# Patient Record
Sex: Male | Born: 1970 | Race: White | Hispanic: No | Marital: Married | Smoking: Current every day smoker
Health system: Southern US, Community
[De-identification: ages and names within clinical notes are randomized; demographics above are authoritative.]

## PROBLEM LIST (undated history)

## (undated) DIAGNOSIS — I2510 Atherosclerotic heart disease of native coronary artery without angina pectoris: Secondary | ICD-10-CM

## (undated) DIAGNOSIS — G4730 Sleep apnea, unspecified: Secondary | ICD-10-CM

## (undated) DIAGNOSIS — M1990 Unspecified osteoarthritis, unspecified site: Secondary | ICD-10-CM

## (undated) DIAGNOSIS — I10 Essential (primary) hypertension: Secondary | ICD-10-CM

## (undated) DIAGNOSIS — I219 Acute myocardial infarction, unspecified: Secondary | ICD-10-CM

## (undated) DIAGNOSIS — E785 Hyperlipidemia, unspecified: Secondary | ICD-10-CM

## (undated) HISTORY — DX: Atherosclerotic heart disease of native coronary artery without angina pectoris: I25.10

## (undated) HISTORY — DX: Sleep apnea, unspecified: G47.30

## (undated) HISTORY — DX: Essential (primary) hypertension: I10

## (undated) HISTORY — DX: Hyperlipidemia, unspecified: E78.5

## (undated) HISTORY — DX: Acute myocardial infarction, unspecified: I21.9

## (undated) HISTORY — PX: NO PAST SURGERIES: SHX2092

## (undated) HISTORY — DX: Unspecified osteoarthritis, unspecified site: M19.90

---

## 2014-01-25 DIAGNOSIS — L700 Acne vulgaris: Secondary | ICD-10-CM | POA: Insufficient documentation

## 2015-12-30 HISTORY — PX: LEFT HEART CATH: SHX5946

## 2016-03-11 DIAGNOSIS — F17210 Nicotine dependence, cigarettes, uncomplicated: Secondary | ICD-10-CM | POA: Insufficient documentation

## 2016-03-11 DIAGNOSIS — I10 Essential (primary) hypertension: Secondary | ICD-10-CM | POA: Insufficient documentation

## 2016-03-13 DIAGNOSIS — I214 Non-ST elevation (NSTEMI) myocardial infarction: Secondary | ICD-10-CM | POA: Insufficient documentation

## 2018-12-06 DIAGNOSIS — Z23 Encounter for immunization: Secondary | ICD-10-CM | POA: Insufficient documentation

## 2018-12-06 DIAGNOSIS — M791 Myalgia, unspecified site: Secondary | ICD-10-CM | POA: Insufficient documentation

## 2019-12-01 ENCOUNTER — Telehealth: Payer: Self-pay

## 2019-12-01 ENCOUNTER — Other Ambulatory Visit: Payer: Self-pay

## 2019-12-01 ENCOUNTER — Emergency Department (INDEPENDENT_AMBULATORY_CARE_PROVIDER_SITE_OTHER)
Admission: EM | Admit: 2019-12-01 | Discharge: 2019-12-01 | Disposition: A | Discharge status: Home / Self Care | Payer: PRIVATE HEALTH INSURANCE | Source: Home / Self Care

## 2019-12-01 ENCOUNTER — Ambulatory Visit (HOSPITAL_BASED_OUTPATIENT_CLINIC_OR_DEPARTMENT_OTHER)
Admission: RE | Admit: 2019-12-01 | Discharge: 2019-12-01 | Disposition: A | Discharge status: Home / Self Care | Payer: 59 | Source: Ambulatory Visit | Attending: Family Medicine | Admitting: Family Medicine

## 2019-12-01 DIAGNOSIS — R52 Pain, unspecified: Secondary | ICD-10-CM | POA: Diagnosis not present

## 2019-12-01 DIAGNOSIS — M79604 Pain in right leg: Secondary | ICD-10-CM | POA: Diagnosis not present

## 2019-12-01 MED ORDER — DICLOFENAC SODIUM 75 MG PO TBEC: 75.0000 mg | DELAYED_RELEASE_TABLET | Freq: Two times a day (BID) | ORAL | 0 refills | Status: DC

## 2019-12-01 MED ORDER — METOPROLOL TARTRATE 50 MG PO TABS: 50.0000 mg | ORAL_TABLET | Freq: Two times a day (BID) | ORAL | 2 refills | Status: DC

## 2019-12-01 NOTE — ED Triage Notes (Signed)
Pt c/o leg pain that started last night. Starts in lower back and radiates down RT leg to back of knee. Pt is hypertensive today. Says hes been off metoprolol for a week. Pain 4/10

## 2019-12-01 NOTE — Telephone Encounter (Signed)
Called pt notifying of neg DVT study. Advised to pick up BP meds from pharmacy. Call if any questions.

## 2019-12-01 NOTE — ED Provider Notes (Addendum)
[facility]    CSN: [id number] Arrival date & time: 12/01/19  1504      History   Chief Complaint Chief Complaint  Patient presents with  . Leg Pain    RT    HPI Gen Clagg is a 48 y.o. male.   Pt complains of pain in the back of his right leg,  Pt is worried that he could have a blood clot.  Pt reports he has had sciatica and back problems. Pt reports this pain is different.  Pt denies any injury   No language interpreter was used.  Leg Pain Location:  Leg Leg location:  R leg   History reviewed. No pertinent past medical history.  There are no active problems to display for this patient.   History reviewed. No pertinent surgical history.     Home Medications    Prior to Admission medications   Medication Sig Start Date End Date Taking? Authorizing Provider  aspirin EC 81 MG tablet Take by mouth. 09/05/16  Yes [provider]  sildenafil (VIAGRA) 25 MG tablet Take 25 mg by mouth daily as needed for erectile dysfunction.   Yes [provider]  diclofenac (VOLTAREN) 75 MG EC tablet Take 1 tablet (75 mg total) by mouth 2 (two) times daily. 12/01/19   Elson Areas, PA-C    Family History History reviewed. No pertinent family history.  Social History Social History   Tobacco Use  . Smoking status: Never Smoker  . Smokeless tobacco: Never Used  Substance Use Topics  . Alcohol use: Not on file  . Drug use: Not on file     Allergies   Patient has no known allergies.   Review of Systems Review of Systems  All other systems reviewed and are negative.    Physical Exam Triage Vital Signs ED Triage Vitals  Enc Vitals Group     BP 12/01/19 1530 (!) 188/137     Pulse Rate 12/01/19 1530 100     Resp 12/01/19 1530 18     Temp 12/01/19 1530 98.8 F (37.1 C)     Temp Source 12/01/19 1530 Oral     SpO2 12/01/19 1530 97 %     Weight 12/01/19 1531 212 lb (96.2 kg)     Height 12/01/19 1531 5' 7.5" (1.715 m)     Head  Circumference --      Peak Flow --      Pain Score 12/01/19 1531 4     Pain Loc --      Pain Edu? --      Excl. in GC? --    No data found.  Updated Vital Signs BP (!) 204/136 (BP Location: Left Arm)   Pulse 100   Temp 98.8 F (37.1 C) (Oral)   Resp 18   Ht 5' 7.5" (1.715 m)   Wt 96.2 kg   SpO2 97%   BMI 32.71 kg/m   Visual Acuity Right Eye Distance:   Left Eye Distance:   Bilateral Distance:    Right Eye Near:   Left Eye Near:    Bilateral Near:     Physical Exam Vitals signs and nursing note reviewed.  Constitutional:      Appearance: He is well-developed.  HENT:     Head: Normocephalic and atraumatic.  Eyes:     Conjunctiva/sclera: Conjunctivae normal.  Neck:     Musculoskeletal: Neck supple.  Cardiovascular:     Rate and Rhythm: Normal rate and regular rhythm.  Heart sounds: No murmur.  Pulmonary:     Effort: Pulmonary effort is normal. No respiratory distress.     Breath sounds: Normal breath sounds.  Abdominal:     Palpations: Abdomen is soft.     Tenderness: There is no abdominal tenderness.  Musculoskeletal: Normal range of motion.        General: Tenderness present. No swelling or signs of injury.     Comments: Pain with homan's   Skin:    General: Skin is warm and dry.  Neurological:     General: No focal deficit present.     Mental Status: He is alert.  Psychiatric:        Mood and Affect: Mood normal.      UC Treatments / Results  Labs (all labs ordered are listed, but only abnormal results are displayed) Labs Reviewed - No data to display  EKG   Radiology No results found.  Procedures Procedures (including critical care time)  Medications Ordered in UC Medications - No data to display  Initial Impression / Assessment and Plan / UC Course  I have reviewed the triage vital signs and the nursing notes.  Pertinent labs & imaging results that were available during my care of the patient were reviewed by me and considered in  my medical decision making (see chart for details).     MDM  Pt to [facility] for ultrasound Final Clinical Impressions(s) / UC Diagnoses   Final diagnoses:  Right leg pain   Discharge Instructions   None    ED Prescriptions    Medication Sig Dispense Auth. Provider   diclofenac (VOLTAREN) 75 MG EC tablet Take 1 tablet (75 mg total) by mouth 2 (two) times daily. 20 tablet ,  K, [place]   metoprolol tartrate (LOPRESSOR) 50 MG tablet Take 1 tablet (50 mg total) by mouth 2 (two) times daily. 60 tablet Fransico Meadow, [place]     PDMP not reviewed this encounter.    ULtrasound negative for DVT.  Nurse called pt and advised of result.  Rx for blood pressure.  An After Visit Summary was printed and given to the patient.  Fransico Meadow, PA-C 12/01/19 [place], [place] 12/01/19 1836

## 2019-12-05 ENCOUNTER — Other Ambulatory Visit: Payer: PRIVATE HEALTH INSURANCE

## 2020-04-05 ENCOUNTER — Other Ambulatory Visit: Payer: Self-pay

## 2020-04-05 ENCOUNTER — Emergency Department (INDEPENDENT_AMBULATORY_CARE_PROVIDER_SITE_OTHER)
Admission: EM | Admit: 2020-04-05 | Discharge: 2020-04-05 | Disposition: A | Discharge status: Home / Self Care | Payer: 59 | Source: Home / Self Care

## 2020-04-05 DIAGNOSIS — R6883 Chills (without fever): Secondary | ICD-10-CM

## 2020-04-05 DIAGNOSIS — R197 Diarrhea, unspecified: Secondary | ICD-10-CM

## 2020-04-05 DIAGNOSIS — I10 Essential (primary) hypertension: Secondary | ICD-10-CM

## 2020-04-05 DIAGNOSIS — R61 Generalized hyperhidrosis: Secondary | ICD-10-CM

## 2020-04-05 NOTE — Discharge Instructions (Signed)
°  Please establish care with a primary care provider for ongoing healthcare needs including blood pressure management.  You may want to put your blood pressure medication in a place that you see every day- either next to your toothbrush or on your night stand or wherever you keep your wallet and keys to make sure you take your medication twice daily as prescribed. You can even try setting a daily alarm on your phone as a reminder. If your blood pressure stays as high as it has been running, you are at an increased risk of kidney failure (may need dialysis), heart attack, stroke, and early death.

## 2020-04-05 NOTE — ED Triage Notes (Signed)
Patient presents to Urgent Care with complaints of waking up early this morning with chills, sweating, and abdominal pain w/ frequent BMs. Patient reports he ate french toast and eggs last night for dinner.

## 2020-04-05 NOTE — ED Provider Notes (Signed)
[facility]    CSN: [id number] Arrival date & time: 04/05/20  4268      History   Chief Complaint Chief Complaint  Patient presents with  . Chills  . Abdominal Pain    HPI Wesley Duran is a 49 y.o. male.   HPI  Wesley Duran is a 49 y.o. male presenting to UC with c/o sudden onset sweats, chills, and diarrhea this morning with about 4-5 episodes of loose to watery stool.  Associated mild diffuse abdominal cramping that has since resolved.  He is most concerned about having the sweats and chills at the same time as he has never had that happen before. Denies fever.  No cough, congestion, sick contacts or recent travel. He had french toast and eggs last night and felt well yesterday until this morning. Pt is concerned for possible covid despite no known contacts. He would like to be tested.  BP elevated- 180/121. Denies HA, dizziness or chest pain. Per medical records, pt has had consistently high/uncontrolled HTN. He reports taking his medication in the morning but forgets to take it in the evening.  He does plan to reestablish care with a PCP and would like to establish at [facility].    History reviewed. No pertinent past medical history.  There are no problems to display for this patient.   History reviewed. No pertinent surgical history.     Home Medications    Prior to Admission medications   Medication Sig Start Date End Date Taking? Authorizing Provider  aspirin EC 81 MG tablet Take by mouth. 09/05/16   [provider]  diclofenac (VOLTAREN) 75 MG EC tablet Take 1 tablet (75 mg total) by mouth 2 (two) times daily. 12/01/19   Elson Areas, PA-C  metoprolol tartrate (LOPRESSOR) 50 MG tablet Take 1 tablet (50 mg total) by mouth 2 (two) times daily. 12/01/19 11/30/20  Elson Areas, PA-C  sildenafil (VIAGRA) 25 MG tablet Take 25 mg by mouth daily as needed for erectile dysfunction.    [provider]    Family  History Family History  Problem Relation Age of Onset  . Hypertension Mother   . Diabetes Father   . Heart failure Father     Social History Social History   Tobacco Use  . Smoking status: Current Every Day Smoker    Packs/day: 0.75  . Smokeless tobacco: Never Used  Substance Use Topics  . Alcohol use: Yes    Comment: frequently but not daily  . Drug use: Not on file     Allergies   Patient has no known allergies.   Review of Systems Review of Systems  Constitutional: Positive for chills and diaphoresis. Negative for fever.  HENT: Negative for congestion, ear pain, sore throat, trouble swallowing and voice change.   Respiratory: Negative for cough and shortness of breath.   Cardiovascular: Negative for chest pain and palpitations.  Gastrointestinal: Positive for abdominal pain and diarrhea. Negative for nausea and vomiting.  Musculoskeletal: Negative for arthralgias, back pain and myalgias.  Skin: Negative for rash.  Neurological: Negative for dizziness, light-headedness and headaches.  All other systems reviewed and are negative.    Physical Exam Triage Vital Signs ED Triage Vitals  Enc Vitals Group     BP 04/05/20 0939 (!) 180/121     Pulse Rate 04/05/20 0939 82     Resp 04/05/20 0939 16     Temp 04/05/20 0939 98.9 F (37.2 C)  Temp Source 04/05/20 0939 Oral     SpO2 04/05/20 0939 97 %     Weight --      Height --      Head Circumference --      Peak Flow --      Pain Score 04/05/20 0937 0     Pain Loc --      Pain Edu? --      Excl. in GC? --    No data found.  Updated Vital Signs BP (!) 180/121 (BP Location: Right Arm)   Pulse 82   Temp 98.9 F (37.2 C) (Oral)   Resp 16   SpO2 97%   Visual Acuity Right Eye Distance:   Left Eye Distance:   Bilateral Distance:    Right Eye Near:   Left Eye Near:    Bilateral Near:     Physical Exam Vitals and nursing note reviewed.  Constitutional:      General: He is not in acute distress.     Appearance: He is well-developed. He is not ill-appearing, toxic-appearing or diaphoretic.  HENT:     Head: Normocephalic and atraumatic.     Right Ear: Tympanic membrane and ear canal normal.     Left Ear: Tympanic membrane and ear canal normal.     Nose: Nose normal.     Right Sinus: No maxillary sinus tenderness or frontal sinus tenderness.     Left Sinus: No maxillary sinus tenderness or frontal sinus tenderness.     Mouth/Throat:     Lips: Pink.     Mouth: Mucous membranes are moist.     Pharynx: Oropharynx is clear. Uvula midline.  Cardiovascular:     Rate and Rhythm: Normal rate and regular rhythm.  Pulmonary:     Effort: Pulmonary effort is normal. No respiratory distress.     Breath sounds: Normal breath sounds. No stridor. No wheezing, rhonchi or rales.  Abdominal:     General: There is no distension.     Palpations: Abdomen is soft.     Tenderness: There is no abdominal tenderness. There is no right CVA tenderness or left CVA tenderness.  Musculoskeletal:        General: Normal range of motion.     Cervical back: Normal range of motion.  Skin:    General: Skin is warm and dry.  Neurological:     Mental Status: He is alert and oriented to person, place, and time.  Psychiatric:        Behavior: Behavior normal.      UC Treatments / Results  Labs (all labs ordered are listed, but only abnormal results are displayed) Labs Reviewed  NOVEL CORONAVIRUS, NAA    EKG   Radiology No results found.  Procedures Procedures (including critical care time)  Medications Ordered in UC Medications - No data to display  Initial Impression / Assessment and Plan / UC Course  I have reviewed the triage vital signs and the nursing notes.  Pertinent labs & imaging results that were available during my care of the patient were reviewed by me and considered in my medical decision making (see chart for details).     Pt appears well, NAD Benign abdominal exam Encouraged  bland diet until feeling better Covid test- pending  Discussed importance of getting his BP under control. AVS provided  Final Clinical Impressions(s) / UC Diagnoses   Final diagnoses:  Chills  Diarrhea, unspecified type  Diaphoresis  Uncontrolled hypertension     Discharge  Instructions      Please establish care with a primary care provider for ongoing healthcare needs including blood pressure management.  You may want to put your blood pressure medication in a place that you see every day- either next to your toothbrush or on your night stand or wherever you keep your wallet and keys to make sure you take your medication twice daily as prescribed. You can even try setting a daily alarm on your phone as a reminder. If your blood pressure stays as high as it has been running, you are at an increased risk of kidney failure (may need dialysis), heart attack, stroke, and early death.     ED Prescriptions    None     PDMP not reviewed this encounter.   Noe Gens, PA-C 04/05/20 1045

## 2020-04-06 LAB — NOVEL CORONAVIRUS, NAA: SARS-CoV-2, NAA: NOT DETECTED

## 2020-04-06 LAB — SARS-COV-2, NAA 2 DAY TAT

## 2020-04-12 ENCOUNTER — Encounter: Payer: Self-pay | Admitting: Medical-Surgical

## 2020-04-12 ENCOUNTER — Ambulatory Visit (INDEPENDENT_AMBULATORY_CARE_PROVIDER_SITE_OTHER): Payer: 59 | Admitting: Medical-Surgical

## 2020-04-12 ENCOUNTER — Other Ambulatory Visit: Payer: Self-pay

## 2020-04-12 VITALS — BP 185/106 | HR 90 | Temp 98.4°F | Ht 67.0 in | Wt 228.1 lb

## 2020-04-12 DIAGNOSIS — F17200 Nicotine dependence, unspecified, uncomplicated: Secondary | ICD-10-CM | POA: Diagnosis not present

## 2020-04-12 DIAGNOSIS — L309 Dermatitis, unspecified: Secondary | ICD-10-CM

## 2020-04-12 DIAGNOSIS — E7849 Other hyperlipidemia: Secondary | ICD-10-CM | POA: Diagnosis not present

## 2020-04-12 DIAGNOSIS — I10 Essential (primary) hypertension: Secondary | ICD-10-CM | POA: Diagnosis not present

## 2020-04-12 MED ORDER — CLOTRIMAZOLE-BETAMETHASONE 1-0.05 % EX CREA: 1.0000 "application " | TOPICAL_CREAM | Freq: Two times a day (BID) | CUTANEOUS | 0 refills | Status: DC

## 2020-04-12 MED ORDER — LOSARTAN POTASSIUM 50 MG PO TABS: 50.0000 mg | ORAL_TABLET | Freq: Every day | ORAL | 3 refills | Status: DC

## 2020-04-12 MED ORDER — METOPROLOL SUCCINATE ER 100 MG PO TB24: 100.0000 mg | ORAL_TABLET | Freq: Every day | ORAL | 3 refills | Status: DC

## 2020-04-12 NOTE — Progress Notes (Signed)
New Patient Office Visit  Subjective:  Patient ID: Wesley Duran, male    DOB: [birth year]  Age: 49 y.o. MRN: [id number]  CC:  Chief Complaint  Patient presents with  . Establish Care  . Hypertension    HPI Wesley Duran presents to establish care and discuss hypertension.  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  Previously treated with diuretic but this was not sufficient and he was changed to metoprolol.  Was taking aspirin 81 mg daily but stopped this approximately 1 year ago.  Prescribed metoprolol 50 mg twice daily but does endorse often forgetting his second dose of the day.  Checking blood pressures sporadically at home with readings in the 180s/100s.  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  Does not add salt to his foods but does endorse eating a lot of salted meats and processed foods.  No intentional exercise.  He reports this is related to long history of back problems and recurring tendinosis.  Does endorse loud snoring and reports that he has never had a sleep study but does have a CPAP at home that he bought online.  Has been unable to tolerate wearing the CPAP so he is using pillows for positioning which has reduced his snoring.  Current every day smoker of approximately 3/4 pack/day since he was 49 years old.  Not currently interested in smoking cessation.  Has 2 small spots on his left lateral shin that started approximately 2 weeks ago and are very itchy.  Has been using OTC hydrocortisone cream and covering with Band-Aids.  This is reduce the itch but the spots have not resolved.  Reports that the areas get dry and somewhat scaly.  Family and personal history of high cholesterol refractory to medications and lifestyle changes.  Previously treated with atorvastatin and rosuvastatin but stopped these as he developed muscle pains.  Past Medical History:  Diagnosis Date  . Heart attack (HCC)   . Hyperlipidemia   . Hypertension     Past Surgical History:   Procedure Laterality Date  . NO PAST SURGERIES      Family History  Problem Relation Age of Onset  . Hypertension Mother   . Diabetes Father   . Heart failure Father   . Heart attack Father   . Leukemia Maternal Grandmother   . Prostate cancer Maternal Grandfather     Social History   Socioeconomic History  . Marital status: Single    Spouse name: Not on file  . Number of children: Not on file  . Years of education: Not on file  . Highest education level: Not on file  Occupational History  . Not on file  Tobacco Use  . Smoking status: Current Every Day Smoker    Packs/day: 1.00    Years: 30.00    Pack years: 30.00  . Smokeless tobacco: Never Used  Substance and Sexual Activity  . Alcohol use: Yes    Alcohol/week: 10.0 - 15.0 standard drinks    Types: 10 - 15 Shots of liquor per week  . Drug use: Never  . Sexual activity: Yes    Partners: Female    Birth control/protection: None  Other Topics Concern  . Not on file  Social History Narrative  . Not on file   Social Determinants of Health   Financial Resource Strain:   . Difficulty of Paying Living Expenses:   Food Insecurity:   . Worried About Programme researcher, broadcasting/film/video in the Last Year:   .  Ran Out of Food in the Last Year:   Transportation Needs:   . Freight forwarder (Medical):   Marland Kitchen Lack of Transportation (Non-Medical):   Physical Activity:   . Days of Exercise per Week:   . Minutes of Exercise per Session:   Stress:   . Feeling of Stress :   Social Connections:   . Frequency of Communication with Friends and Family:   . Frequency of Social Gatherings with Friends and Family:   . Attends Religious Services:   . Active Member of Clubs or Organizations:   . Attends Banker Meetings:   Marland Kitchen Marital Status:   Intimate Partner Violence:   . Fear of Current or Ex-Partner:   . Emotionally Abused:   Marland Kitchen Physically Abused:   . Sexually Abused:     ROS Review of Systems  Constitutional: Negative  for chills, fatigue, fever and unexpected weight change.  Eyes: Negative for visual disturbance.  Respiratory: Negative for cough, chest tightness and shortness of breath.   Cardiovascular: Negative for chest pain, palpitations and leg swelling.  Gastrointestinal: Negative for abdominal pain, blood in stool, constipation, diarrhea, nausea and vomiting.  Genitourinary: Negative for dysuria, frequency and hematuria.  Musculoskeletal: Positive for back pain, myalgias and neck pain.  Skin: Positive for rash.       Wartlike growth to the lateral right nostril, interested in having it removed.  Neurological: Negative for dizziness, light-headedness and headaches.  Psychiatric/Behavioral: Negative for self-injury, sleep disturbance and suicidal ideas.    Objective:   Today's Vitals: BP (!) 185/106   Pulse 90   Temp 98.4 F (36.9 C) (Oral)   Ht 5\' 7"  (1.702 m)   Wt 228 lb 1.6 oz (103.5 kg)   SpO2 97%   BMI 35.73 kg/m   Physical Exam Constitutional:      General: He is not in acute distress.    Appearance: Normal appearance.  HENT:     Head: Normocephalic and atraumatic.  Cardiovascular:     Rate and Rhythm: Normal rate and regular rhythm.     Pulses: Normal pulses.     Heart sounds: Normal heart sounds. No murmur. No friction rub. No gallop.   Pulmonary:     Effort: Pulmonary effort is normal. No respiratory distress.     Breath sounds: Normal breath sounds.  Skin:    General: Skin is warm and dry.     Findings: Lesion (Wartlike growth to the lateral right nostril) and rash present.  Neurological:     Mental Status: He is alert and oriented to person, place, and time.  Psychiatric:        Mood and Affect: Mood normal.        Behavior: Behavior normal.        Thought Content: Thought content normal.        Judgment: Judgment normal.     Assessment & Plan:   1. Essential hypertension Changing Lopressor to Toprol XL 100 mg daily.  Starting losartan 50 mg daily.  Checking  CBC, CMP, lipid panel today.  Reviewed dietary modifications and recommendations for increased exercise and weight loss.  Advised patient to check blood pressures at home at least once a day and maintain a log to bring with him to his nurse visit in 2 weeks. - metoprolol succinate (TOPROL-XL) 100 MG 24 hr tablet; Take 1 tablet (100 mg total) by mouth daily. Take with or immediately following a meal.  Dispense: 90 tablet; Refill: 3 - losartan (COZAAR)  50 MG tablet; Take 1 tablet (50 mg total) by mouth daily.  Dispense: 90 tablet; Refill: 3 - CBC - COMPLETE METABOLIC PANEL WITH GFR - Lipid panel  2. Dermatitis Some response to steroid cream.  Given that the lesions get dry and scaly may have a fungal component.  We will try Lotrisone cream twice daily as needed. - clotrimazole-betamethasone (LOTRISONE) cream; Apply 1 application topically 2 (two) times daily.  Dispense: 45 g; Refill: 0  3. Tobacco use disorder Reviewed risks versus benefits of smoking.  Patient aware of health concerns with smoking and states that he will let me know when he is ready to quit.  4. Familial hyperlipidemia Checking lipid panel today.  May need trial and error to find a medication that he can tolerate for cholesterol control.   Outpatient Encounter Medications as of 04/12/2020  Medication Sig  . sildenafil (VIAGRA) 25 MG tablet Take 25 mg by mouth daily as needed for erectile dysfunction.  . [DISCONTINUED] metoprolol tartrate (LOPRESSOR) 50 MG tablet Take 1 tablet (50 mg total) by mouth 2 (two) times daily.  Marland Kitchen aspirin EC 81 MG tablet Take by mouth.  . losartan (COZAAR) 50 MG tablet Take 1 tablet (50 mg total) by mouth daily.  . metoprolol succinate (TOPROL-XL) 100 MG 24 hr tablet Take 1 tablet (100 mg total) by mouth daily. Take with or immediately following a meal.  . [DISCONTINUED] diclofenac (VOLTAREN) 75 MG EC tablet Take 1 tablet (75 mg total) by mouth 2 (two) times daily. (Patient not taking: Reported on  04/12/2020)   No facility-administered encounter medications on file as of 04/12/2020.    Follow-up: Return in about 2 weeks (around 04/26/2020) for nurse visit for BP check.   Clearnce Sorrel, DNP, APRN, FNP-BC [facility]

## 2020-04-14 LAB — COMPLETE METABOLIC PANEL WITH GFR
AG Ratio: 1.7 (calc) (ref 1.0–2.5)
ALT: 31 U/L (ref 9–46)
AST: 19 U/L (ref 10–40)
Albumin: 4.3 g/dL (ref 3.6–5.1)
Alkaline phosphatase (APISO): 58 U/L (ref 36–130)
BUN: 16 mg/dL (ref 7–25)
CO2: 30 mmol/L (ref 20–32)
Calcium: 10 mg/dL (ref 8.6–10.3)
Chloride: 100 mmol/L (ref 98–110)
Creat: 0.86 mg/dL (ref 0.60–1.35)
GFR, Est African American: 120 mL/min/{1.73_m2} (ref >=60)
GFR, Est Non African American: 103 mL/min/{1.73_m2} (ref >=60)
Globulin: 2.6 g/dL (calc) (ref 1.9–3.7)
Glucose, Bld: 105 mg/dL (ref 65–139)
Potassium: 4.2 mmol/L (ref 3.5–5.3)
Sodium: 138 mmol/L (ref 135–146)
Total Bilirubin: 0.5 mg/dL (ref 0.2–1.2)
Total Protein: 6.9 g/dL (ref 6.1–8.1)

## 2020-04-14 LAB — CBC
HCT: 40.2 % (ref 38.5–50.0)
Hemoglobin: 13.8 g/dL (ref 13.2–17.1)
MCH: 31 pg (ref 27.0–33.0)
MCHC: 34.3 g/dL (ref 32.0–36.0)
MCV: 90.3 fL (ref 80.0–100.0)
MPV: 9.9 fL (ref 7.5–12.5)
Platelets: 301 10*3/uL (ref 140–400)
RBC: 4.45 10*6/uL (ref 4.20–5.80)
RDW: 12.3 % (ref 11.0–15.0)
WBC: 10.7 10*3/uL (ref 3.8–10.8)

## 2020-04-14 LAB — LIPID PANEL
Cholesterol: 234 mg/dL — ABNORMAL HIGH (ref <200)
HDL: 40 mg/dL (ref >=40)
LDL Cholesterol (Calc): 150 mg/dL (calc) — ABNORMAL HIGH
Non-HDL Cholesterol (Calc): 194 mg/dL (calc) — ABNORMAL HIGH (ref <130)
Total CHOL/HDL Ratio: 5.9 (calc) — ABNORMAL HIGH (ref <5.0)
Triglycerides: 268 mg/dL — ABNORMAL HIGH (ref <150)

## 2020-04-15 MED ORDER — ATORVASTATIN CALCIUM 20 MG PO TABS: 20.0000 mg | ORAL_TABLET | Freq: Every day | ORAL | 3 refills | Status: DC

## 2020-04-15 NOTE — Addendum Note (Signed)
Addended byChristen Butter on: 04/15/2020 08:08 PM   Modules accepted: Orders

## 2020-04-26 ENCOUNTER — Ambulatory Visit (INDEPENDENT_AMBULATORY_CARE_PROVIDER_SITE_OTHER): Payer: 59 | Admitting: Medical-Surgical

## 2020-04-26 VITALS — BP 158/83 | HR 72

## 2020-04-26 DIAGNOSIS — I10 Essential (primary) hypertension: Secondary | ICD-10-CM

## 2020-04-26 MED ORDER — HYDROCHLOROTHIAZIDE 12.5 MG PO TABS: 12.5000 mg | ORAL_TABLET | Freq: Every day | ORAL | 0 refills | Status: DC

## 2020-04-26 NOTE — Progress Notes (Signed)
Patient comes in for blood pressure check. He was changed from Lopressor to Toprol XL 100 mg daily and Losartan 50 mg at last visit on 04/12/2020. He denies any missed doses, chest pain, palpitations, or dizziness. He does state some mild shortness of breath today, but also having congestion in his head and feels like allergy related.   He has been taking blood medications in the morning and checking blood pressures at home in the afternoon/evenings. He did not bring a log with him but states the average reading has been around 160/90.  His first reading today is 172/98, HR 77. After allowing patient to sit in the office for 10 minutes his second reading today is 158/83, HR 72.   I spoke with Joy who advised to had HCTZ 12.5 mg daily and return in two weeks for recheck of blood pressure.

## 2020-04-26 NOTE — Patient Instructions (Signed)
Stay on current medications.  Wesley Duran had HCTZ 12.5 mg - one tablet daily.  Return in two weeks for recheck of blood pressure.

## 2020-05-10 ENCOUNTER — Ambulatory Visit: Payer: 59

## 2020-05-16 ENCOUNTER — Ambulatory Visit: Payer: 59

## 2020-05-19 ENCOUNTER — Other Ambulatory Visit: Payer: Self-pay | Admitting: Medical-Surgical

## 2020-05-24 ENCOUNTER — Other Ambulatory Visit: Payer: Self-pay

## 2020-05-24 ENCOUNTER — Ambulatory Visit (INDEPENDENT_AMBULATORY_CARE_PROVIDER_SITE_OTHER): Payer: Commercial Managed Care - PPO | Admitting: Medical-Surgical

## 2020-05-24 VITALS — BP 162/94 | HR 81 | Ht 67.0 in | Wt 225.0 lb

## 2020-05-24 DIAGNOSIS — I10 Essential (primary) hypertension: Secondary | ICD-10-CM | POA: Diagnosis not present

## 2020-05-24 NOTE — Progress Notes (Signed)
   Subjective:    Patient ID: Wesley Duran, male    DOB: 03-21-1971, 49 y.o.   MRN: [id number]  HPI Patient presents to office for blood pressure recheck. Patient reports taking HCTZ, losartan, and metoprolol. Denies missing any doses. Denies any headachesmshortness of breath, or headaches.    Review of Systems     Objective:   Physical Exam        Assessment & Plan:  Patient's initial blood pressure reading was elevated at 172/87, pulse 97. Patient was advised to sit and relax for 10 minutes. Recheck came down to 162/94, pulse 81.   Discussed with provider and patient was advised to increase losartan to 100 mg and recheck in 2 weeks. Advised patient to check blood pressure during the next two weeks and bring those readings to his next visit.   HM patient confirms that he received his first COVID vaccine on Saturday. This has been documented.

## 2020-06-07 ENCOUNTER — Ambulatory Visit: Payer: Commercial Managed Care - PPO

## 2020-06-07 ENCOUNTER — Telehealth: Payer: Self-pay

## 2020-06-07 MED ORDER — LOSARTAN POTASSIUM 100 MG PO TABS: 100.0000 mg | ORAL_TABLET | Freq: Every day | ORAL | 3 refills | Status: DC

## 2020-06-07 NOTE — Telephone Encounter (Signed)
Wesley Duran has been rescheduled. Medication sent.

## 2020-06-07 NOTE — Telephone Encounter (Signed)
Yes!  Please send in the 100 mg tabs for him.  I was not aware this was not sent with his last nurse visit.  Once he has been on this dose for 2 weeks, we will recheck his blood pressure with a nurse visit.  Please pass along my apologies!

## 2020-06-07 NOTE — Telephone Encounter (Signed)
Wesley Duran has a nurse visit today. He is out of the Losartan due to the increase to 100 mg he was not able to refill. I can reschedule him for later. Can I send in a new prescription for the Losartan 100 mg.

## 2020-06-15 ENCOUNTER — Other Ambulatory Visit: Payer: Self-pay | Admitting: Medical-Surgical

## 2020-06-21 ENCOUNTER — Ambulatory Visit (INDEPENDENT_AMBULATORY_CARE_PROVIDER_SITE_OTHER): Payer: Commercial Managed Care - PPO | Admitting: Medical-Surgical

## 2020-06-21 ENCOUNTER — Other Ambulatory Visit: Payer: Self-pay

## 2020-06-21 ENCOUNTER — Encounter: Payer: Self-pay | Admitting: Medical-Surgical

## 2020-06-21 VITALS — BP 170/95 | HR 83 | Temp 98.2°F | Ht 67.0 in | Wt 225.2 lb

## 2020-06-21 DIAGNOSIS — I10 Essential (primary) hypertension: Secondary | ICD-10-CM | POA: Diagnosis not present

## 2020-06-21 DIAGNOSIS — B079 Viral wart, unspecified: Secondary | ICD-10-CM | POA: Diagnosis not present

## 2020-06-21 MED ORDER — HYDROCHLOROTHIAZIDE 25 MG PO TABS: 25.0000 mg | ORAL_TABLET | Freq: Every day | ORAL | 1 refills | Status: DC

## 2020-06-21 NOTE — Progress Notes (Signed)
Subjective:    CC: Facial skin lesion removal  HPI: Pleasant 49 year old male presenting today to have a skin lesion located on the external right nostril removed.  Reports this lesion has been present for approximately a year.  Notes that he had a small area that seemed to be dry skin on the edge of it a few days ago.  He tried to pull the dry skin off which caused the lesion to bleed profusely.  He was able to get it stopped by holding pressure.  He reports he has not had any lesions like this before and has never had anyone tried to remove this one.  Hypertension-taking losartan 100 mg daily, Toprol-XL 100 mg daily, and HCTZ 12.5 mg daily.  Tolerating medications well without side effects.  Does occasionally check his blood pressure at home and notes that his readings are still somewhat elevated.  Notes that he has had a couple of readings in the 924Q systolically.  Denies chest pain, shortness of breath, lower extremity edema, dizziness, headaches, and palpitations.  He does have a history of sleep apnea, has a CPAP at home but does not use it.  I reviewed the past medical history, family history, social history, surgical history, and allergies today and no changes were needed.  Please see the problem list section below in epic for further details.  Past Medical History: Past Medical History:  Diagnosis Date  . Heart attack ([place])   . Hyperlipidemia   . Hypertension    Past Surgical History: Past Surgical History:  Procedure Laterality Date  . NO PAST SURGERIES     Social History: Social History   Socioeconomic History  . Marital status: Single    Spouse name: Not on file  . Number of children: Not on file  . Years of education: Not on file  . Highest education level: Not on file  Occupational History  . Not on file  Tobacco Use  . Smoking status: Current Every Day Smoker    Packs/day: 1.00    Years: 30.00    Pack years: 30.00  . Smokeless tobacco: Never Used  Vaping Use   . Vaping Use: Never used  Substance and Sexual Activity  . Alcohol use: Yes    Alcohol/week: 10.0 - 15.0 standard drinks    Types: 10 - 15 Shots of liquor per week  . Drug use: Never  . Sexual activity: Yes    Partners: Female    Birth control/protection: None  Other Topics Concern  . Not on file  Social History Narrative  . Not on file   Social Determinants of Health   Financial Resource Strain:   . Difficulty of Paying Living Expenses:   Food Insecurity:   . Worried About Charity fundraiser in the Last Year:   . Arboriculturist in the Last Year:   Transportation Needs:   . Film/video editor (Medical):   Marland Kitchen Lack of Transportation (Non-Medical):   Physical Activity:   . Days of Exercise per Week:   . Minutes of Exercise per Session:   Stress:   . Feeling of Stress :   Social Connections:   . Frequency of Communication with Friends and Family:   . Frequency of Social Gatherings with Friends and Family:   . Attends Religious Services:   . Active Member of Clubs or Organizations:   . Attends Archivist Meetings:   Marland Kitchen Marital Status:    Family History: Family History  Problem  Relation Age of Onset  . Hypertension Mother   . Diabetes Father   . Heart failure Father   . Heart attack Father   . Leukemia Maternal Grandmother   . Prostate cancer Maternal Grandfather    Allergies: No Known Allergies Medications: See med rec.  Review of Systems: See HPI for pertinent positives and negatives.   Objective:    General: Well Developed, well nourished, and in no acute distress.  Neuro: Alert and oriented x3.  HEENT: Normocephalic, atraumatic.  Skin: Warm and dry.  Large wartlike growth connected by a thin stalk just anterior to the right alar nasal sulcus.  Cardiac: Regular rate and rhythm, no murmurs rubs or gallops, no lower extremity edema.  Respiratory: Clear to auscultation bilaterally. Not using accessory muscles, speaking in full  sentences.  Impression and Recommendations:    1. Wart of face EXCISION OF SUSPICIOUS SKIN LESION OF THE RIGHT NOSE: Risks (infection, pain, scarring, bleeding, not getting full margins, anesthetic risks, etc.) and benefits (finding out nature of this suspicious lesion) explained to [facility], the patient, for this procedure. Patient expressed understanding that no guarantees about the results of this procedure can be made. Patient also expressed understanding that if the lesion is malignant (cancerous), subsequent wider excision may need to be done. All questions answered and patient opts to proceed with the excisional biopsy.  After obtaining consent for excision of the suspicious lesion in the location described above, the area was marked, cleaned, and anesthesized by field block using 1% lidocaine without epinephrine achieving moderate results. Using a 10 blade, the lesion was excised at the stalk with no need for closure with sutures. Patient tolerated the procedure well. Hemostasis of subcutaneous tissue was achieved with silver nitrate, taking care to avoid cautery of the dermis/epidermis.The specimen was sent to pathology for biopsy.    Wound was evaluated for hemostasis and left open to air. Patient was given wound care instruction and was instructed to take Tylenol or ibuprofen if any pain. In addition, patient was instructed to return to the clinic if increased pain, erythema or other issues. All questions answered, patient instructed to call the office with any questions/concerns.  - Surgical pathology  2. Essential hypertension Still not at goal despite recent medication dose changes.  Continue losartan 100 mg daily, Toprol-XL 100 mg daily.  Increasing HCTZ to 25 mg daily.  Continue checking blood pressures daily and maintain a log.  As he has been very reliable about returning every 2 weeks and checking his blood pressures at home, we will reach out to him for an evaluation in 2 weeks to  see if his blood pressures have seen any benefit.  Discussed sleep apnea and its role and resistant hypertension.  He already has a CPAP at home, does not use it as he prefers to use various pillows for positioning.  Advised that he may consider attempting to use his CPAP for several nights to see if this makes it difference in his blood pressures.  Patient verbalized understanding and agreeable to the plan.  Return in about 4 months (around 10/21/2020) for Hypertension follow-up. ___________________________________________ Thayer Ohm, DNP, APRN, FNP-BC Primary Care and Sports Medicine [facility] [place]

## 2020-07-05 ENCOUNTER — Telehealth: Payer: Self-pay

## 2020-07-05 NOTE — Telephone Encounter (Signed)
Called and spoke with pt who states that he is currently on vacation in [place] and that he did not take his BP machine with him due to packing. He states that he did check his BP last week and kept a log but he does not have that in front of him. I asked if he could remember any of the readings from last week and he said the one he did right before going on vacation was 174/92. He states that he told Joy that when he gets back from vacation that he was going to set up his MyChart account and would then be sending portal messages with his BP log listed.

## 2020-07-05 NOTE — Telephone Encounter (Signed)
-----   Message from Christen Butter, NP sent at 07/05/2020  8:06 AM EDT ----- Regarding: FW: Blood pressure checkin Can you call Wesley Duran to see how his BP has been running since his visit a couple of weeks ago? He had  come every 2 weeks reliably and assured me he would keep a record at home this time.  Thanks, Joy  ----- Message ----- From: Christen Butter, NP Sent: 07/05/2020 To: Christen Butter, NP Subject: Blood pressure checkin                         Time for a 2-week check-in for blood pressure via MyChart or telephone.

## 2020-08-16 ENCOUNTER — Other Ambulatory Visit: Payer: Self-pay

## 2020-08-16 ENCOUNTER — Encounter: Payer: Self-pay | Admitting: Medical-Surgical

## 2020-08-16 ENCOUNTER — Ambulatory Visit (INDEPENDENT_AMBULATORY_CARE_PROVIDER_SITE_OTHER): Payer: Commercial Managed Care - PPO

## 2020-08-16 ENCOUNTER — Telehealth (INDEPENDENT_AMBULATORY_CARE_PROVIDER_SITE_OTHER): Payer: Commercial Managed Care - PPO | Admitting: Medical-Surgical

## 2020-08-16 VITALS — Temp 98.0°F

## 2020-08-16 DIAGNOSIS — J22 Unspecified acute lower respiratory infection: Secondary | ICD-10-CM

## 2020-08-16 DIAGNOSIS — R0609 Other forms of dyspnea: Secondary | ICD-10-CM

## 2020-08-16 DIAGNOSIS — R0600 Dyspnea, unspecified: Secondary | ICD-10-CM

## 2020-08-16 DIAGNOSIS — R05 Cough: Secondary | ICD-10-CM

## 2020-08-16 DIAGNOSIS — R059 Cough, unspecified: Secondary | ICD-10-CM

## 2020-08-16 MED ORDER — PREDNISONE 50 MG PO TABS: 50.0000 mg | ORAL_TABLET | Freq: Every day | ORAL | 0 refills | Status: DC

## 2020-08-16 MED ORDER — HYDROCODONE-HOMATROPINE 5-1.5 MG/5ML PO SYRP: 5.0000 mL | ORAL_SOLUTION | Freq: Four times a day (QID) | ORAL | 0 refills | Status: DC | PRN

## 2020-08-16 MED ORDER — AZITHROMYCIN 250 MG PO TABS: ORAL_TABLET | ORAL | 0 refills | Status: DC

## 2020-08-16 MED ORDER — ALBUTEROL SULFATE HFA 108 (90 BASE) MCG/ACT IN AERS: 2.0000 | INHALATION_SPRAY | Freq: Four times a day (QID) | RESPIRATORY_TRACT | 0 refills | Status: DC | PRN

## 2020-08-16 NOTE — Progress Notes (Signed)
Virtual Visit via Video Note  I connected with Henrik Orihuela on 08/16/20 at  2:40 PM EDT by a video enabled telemedicine application and verified that I am speaking with the correct person using two identifiers.   I discussed the limitations of evaluation and management by telemedicine and the availability of in person appointments. The patient expressed understanding and agreed to proceed.  Patient location: home Provider locations: office  Subjective:    CC: upper respiratory symptoms  HPI: Pleasant 49 year old male presenting via MyChart video visit with reports of severe cough chest congestion, chest tightness with DOE, wheezing, headache, diarrhea, loss of/smell, sinus congestion/pressure, rhinorrhea, ear fullness, and postnasal drip.  Having severe coughing fits production green mucus.  Sometimes coughs so hard he gets lightheaded and "see stars". Denies fever, chills, nausea, vomiting, chest pain, and shortness of breath at rest. Has been taking Mucinex cough and congestion, Ibuprofen, and Dayquil without relief. Covid vaccines up to date Warden/ranger). Recent exposure to Covid at work where several employees have gotten sick in the past two weeks. One of his coworkers developed symptoms the day before he did and is currently awaiting a Covid test. Patient has been a smoker of at least 1ppd cigarettes for approximately 30 years. No prior diagnosis of COPD.  Past medical history, Surgical history, Family history not pertinant except as noted below, Social history, Allergies, and medications have been entered into the medical record, reviewed, and corrections made.   Review of Systems: See HPI for pertinent positives and negatives.   Objective:    General: Speaking clearly in complete sentences without any shortness of breath.  Strong cough. Alert and oriented x3.  Normal judgment. No apparent acute distress.  Impression and Recommendations:    1. Dyspnea on exertion/cough Suspect possible  undiagnosed COPD with smoking history complicated by acute respiratory infection. STAT chest x-ray. Drive up COVID test today.  - DG Chest 2 View; Future - Novel Coronavirus, NAA ([facility])  2. Acute respiratory infection COVID test today. While symptoms likely related to a viral URI (covid vs RSV vs common cold), cannot rule out undiagnosed COPD with exacerbation. We will treat agressively with Azithromycin and Prednisone. Also sending in Hycodan cough syrup and albuterol inhaler. Discussed quarantine recommendations. Reviewed symptoms that should prompt emergency care. Patient verbalized understanding and is agreeable to the plan. - Novel Coronavirus, NAA ([facility])  No follow-ups on file.  20 minutes of non-face-to-face time was provided during this encounter.  I discussed the assessment and treatment plan with the patient. The patient was provided an opportunity to ask questions and all were answered. The patient agreed with the plan and demonstrated an understanding of the instructions.   The patient was advised to call back or seek an in-person evaluation if the symptoms worsen or if the condition fails to improve as anticipated.  Thayer Ohm, DNP, APRN, FNP-BC [facility] and Sports Medicine

## 2020-08-16 NOTE — Telephone Encounter (Signed)
Pt scheduled for OV with Joy today.

## 2020-08-18 LAB — NOVEL CORONAVIRUS, NAA: SARS-CoV-2, NAA: NOT DETECTED

## 2020-08-18 LAB — SARS-COV-2, NAA 2 DAY TAT

## 2020-09-02 ENCOUNTER — Other Ambulatory Visit: Payer: Self-pay | Admitting: Medical-Surgical

## 2020-09-30 ENCOUNTER — Encounter: Payer: Self-pay | Admitting: Medical-Surgical

## 2020-10-04 ENCOUNTER — Telehealth: Payer: Commercial Managed Care - PPO | Admitting: Physician Assistant

## 2020-10-04 DIAGNOSIS — R399 Unspecified symptoms and signs involving the genitourinary system: Secondary | ICD-10-CM

## 2020-10-04 MED ORDER — CEPHALEXIN 500 MG PO CAPS: 500.0000 mg | ORAL_CAPSULE | Freq: Two times a day (BID) | ORAL | 0 refills | Status: DC

## 2020-10-04 NOTE — Progress Notes (Signed)
We are sorry that you are not feeling well.  Here is how we plan to help! ° °Based on what you shared with me it looks like you most likely have a simple urinary tract infection. ° °A UTI (Urinary Tract Infection) is a bacterial infection of the bladder. ° °Most cases of urinary tract infections are simple to treat but a key part of your care is to encourage you to drink plenty of fluids and watch your symptoms carefully. ° °I have prescribed Keflex 500 mg twice a day for 7 days.  Your symptoms should gradually improve. Call us if the burning in your urine worsens, you develop worsening fever, back pain or pelvic pain or if your symptoms do not resolve after completing the antibiotic. ° °Urinary tract infections can be prevented by drinking plenty of water to keep your body hydrated.  Also be sure when you wipe, wipe from front to back and don't hold it in!  If possible, empty your bladder every 4 hours. ° °Your e-visit answers were reviewed by a board certified advanced clinical practitioner to complete your personal care plan.  Depending on the condition, your plan could have included both over the counter or prescription medications. ° °If there is a problem please reply  once you have received a response from your provider. ° °Your safety is important to us.  If you have drug allergies check your prescription carefully.   ° °You can use MyChart to ask questions about today’s visit, request a non-urgent call back, or ask for a work or school excuse for 24 hours related to this e-Visit. If it has been greater than 24 hours you will need to follow up with your provider, or enter a new e-Visit to address those concerns. ° ° °You will get an e-mail in the next two days asking about your experience.  I hope that your e-visit has been valuable and will speed your recovery. Thank you for using e-visits. ° °Greater than 5 minutes, yet less than 10 minutes of time have been spent researching, coordinating, and  implementing care for this patient today. ° °Harleyquinn Gasser PA-C ° °

## 2020-10-08 ENCOUNTER — Telehealth: Payer: Commercial Managed Care - PPO | Admitting: Medical-Surgical

## 2020-10-11 ENCOUNTER — Other Ambulatory Visit: Payer: Self-pay

## 2020-10-11 DIAGNOSIS — I10 Essential (primary) hypertension: Secondary | ICD-10-CM

## 2020-10-11 MED ORDER — LOSARTAN POTASSIUM 100 MG PO TABS: 100.0000 mg | ORAL_TABLET | Freq: Every day | ORAL | 0 refills | Status: DC

## 2020-10-11 MED ORDER — METOPROLOL SUCCINATE ER 100 MG PO TB24: 100.0000 mg | ORAL_TABLET | Freq: Every day | ORAL | 0 refills | Status: DC

## 2020-10-11 MED ORDER — HYDROCHLOROTHIAZIDE 25 MG PO TABS: 25.0000 mg | ORAL_TABLET | Freq: Every day | ORAL | 0 refills | Status: DC

## 2020-10-16 ENCOUNTER — Encounter: Payer: Self-pay | Admitting: Medical-Surgical

## 2020-10-16 ENCOUNTER — Telehealth (INDEPENDENT_AMBULATORY_CARE_PROVIDER_SITE_OTHER): Payer: Commercial Managed Care - PPO | Admitting: Medical-Surgical

## 2020-10-16 DIAGNOSIS — N522 Drug-induced erectile dysfunction: Secondary | ICD-10-CM | POA: Diagnosis not present

## 2020-10-16 DIAGNOSIS — E7849 Other hyperlipidemia: Secondary | ICD-10-CM

## 2020-10-16 DIAGNOSIS — I10 Essential (primary) hypertension: Secondary | ICD-10-CM

## 2020-10-16 MED ORDER — EZETIMIBE 10 MG PO TABS: 10.0000 mg | ORAL_TABLET | Freq: Every day | ORAL | 3 refills | Status: DC

## 2020-10-16 MED ORDER — SILDENAFIL CITRATE 20 MG PO TABS: 20.0000 mg | ORAL_TABLET | ORAL | 11 refills | Status: DC | PRN

## 2020-10-16 MED ORDER — METOPROLOL SUCCINATE ER 100 MG PO TB24: 100.0000 mg | ORAL_TABLET | Freq: Every day | ORAL | 1 refills | Status: DC

## 2020-10-16 MED ORDER — LOSARTAN POTASSIUM-HCTZ 100-25 MG PO TABS: 1.0000 | ORAL_TABLET | Freq: Every day | ORAL | 1 refills | Status: DC

## 2020-10-16 MED ORDER — SILDENAFIL CITRATE 20 MG PO TABS
20.0000 mg | ORAL_TABLET | ORAL | 11 refills | Status: DC | PRN
Start: 2020-10-16 — End: 2020-10-18

## 2020-10-16 NOTE — Progress Notes (Signed)
Virtual Visit via Video Note  I connected with Wesley Duran on 10/16/20 at  4:00 PM EDT by a video enabled telemedicine application and verified that I am speaking with the correct person using two identifiers.   I discussed the limitations of evaluation and management by telemedicine and the availability of in person appointments. The patient expressed understanding and agreed to proceed.  Patient location: home Provider locations: office  Subjective:    CC: HTN and ED follow up  HPI: Pleasant 49 year old male presenting via Mychart video visit for follow up of HTN and to discuss ED.  HTN-currently taking losartan 100 mg, HCTZ 25 mg, and Toprol-XL 100 mg daily. Checking his blood pressures at home fairly regularly but has had some increased readings lately. His blood pressure reading today was 178/91. The majority of his blood pressures at home have been 130s/80s. Notes that he feels that he has not been sleeping as well as usual for the past few nights. Thinks that his sleep apnea may have gotten somewhat worse and is considering starting to wear his CPAP. Notes that he has had more caffeine over the past few days and feels this may be related to his increased blood pressure. Denies chest pain, shortness of breath, palpitations, headaches, dizziness, and lower extremity edema.  ED-was receiving Viagra from a different service but would like to transfer the medication here for management. He did get a short supply from a different provider so that he did not run out while waiting for this appointment. Notes that he was taking 75 mg of sildenafil 3-4 times weekly. This works well for him. He does tolerate it without difficulty but notes that he does experience a full body flushing/hot sensation shortly after taking it. Notes that this is tolerable and he would like to continue the medication.  Hyperlipidemia-was prescribed atorvastatin 20 mg daily. He did take this medicine as prescribed but notes  that he started having widespread body and joint aches. He tried taking the medication in several different ways and was unable to tolerate more than a couple of times a week without any increase in his body aches. He has tried statins in the past and did not do well with these either. He is open to medication to help control his cholesterol but would prefer it not be a statin medication.   Past medical history, Surgical history, Family history not pertinant except as noted below, Social history, Allergies, and medications have been entered into the medical record, reviewed, and corrections made.   Review of Systems: See HPI for pertinent positives and negatives.   Objective:    General: Speaking clearly in complete sentences without any shortness of breath.  Alert and oriented x3.  Normal judgment. No apparent acute distress.  Impression and Recommendations:    1. Essential hypertension Continue current regimen of Toprol-XL 100 mg daily and losartan-HCTZ 100-25 mg daily. Limit dietary sodium. Strongly recommend wearing CPAP to treat sleep apnea as I feel this is what is contributing to his resistant hypertension. Advised to monitor blood pressures at home with goal of 130/80 or less. If consistently higher than that, we will need to make some medication changes. Patient verbalized understanding and is agreeable to the plan. I will reach out to him via MyChart in a couple of weeks to see how his blood pressures are running at that time.  2. Familial hyperlipidemia Discussed other medications used to treat hyperlipidemia. He is amenable to trying Zetia 10 mg daily. We will  plan to recheck lipids as well as a metabolic panel in about 6 weeks. Orders in place so we can just drop by the lab and have that taken care of.  3. Drug-induced erectile dysfunction Sildenafil 20 mg tablets, take 1-5 tablets daily as needed. Discussed medication safety and avoidance of nitrates while taking this  medication.  I discussed the assessment and treatment plan with the patient. The patient was provided an opportunity to ask questions and all were answered. The patient agreed with the plan and demonstrated an understanding of the instructions.   The patient was advised to call back or seek an in-person evaluation if the symptoms worsen or if the condition fails to improve as anticipated.  Return in about 3 months (around 01/16/2021) for HTN follow up.   20 minutes of non face-to-face time was provided during this encounter.  Thayer Ohm, DNP, APRN, FNP-BC [facility] and Sports Medicine

## 2020-10-18 ENCOUNTER — Other Ambulatory Visit: Payer: Self-pay

## 2020-10-18 MED ORDER — SILDENAFIL CITRATE 20 MG PO TABS
20.0000 mg | ORAL_TABLET | ORAL | 11 refills | Status: DC | PRN
Start: 2020-10-18 — End: 2021-10-18

## 2020-10-30 ENCOUNTER — Encounter: Payer: Self-pay | Admitting: Medical-Surgical

## 2021-03-02 ENCOUNTER — Encounter: Payer: Self-pay | Admitting: Medical-Surgical

## 2021-03-02 DIAGNOSIS — I252 Old myocardial infarction: Secondary | ICD-10-CM

## 2021-03-02 DIAGNOSIS — I10 Essential (primary) hypertension: Secondary | ICD-10-CM

## 2021-03-04 NOTE — Telephone Encounter (Signed)
Referral placed for Cardiology for [facility] but he will likely get in at [facility] quicker. He is overdue for HTN follow up and needs to be scheduled for an in office visit.

## 2021-05-07 NOTE — Progress Notes (Signed)
Referring-Joy Jessup NP Reason for referral-CAD  HPI: 50 yo male for evaluation of coronary artery disease at request of Christen Butter, NP.  Previously followed by Vilinda Boehringer, MD.  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  He had successful angioplasty at that time and no other obstructive coronary disease was noted; ejection fraction 70%.  Echocardiogram at that time showed normal LV function, moderate left ventricular hypertrophy and trace mitral regurgitation. Patient denies dyspnea on exertion, orthopnea, PND, pedal edema, exertional chest pain or syncope.  Cardiology now asked to evaluate.  Current Outpatient Medications  Medication Sig Dispense Refill  . ezetimibe (ZETIA) 10 MG tablet Take 1 tablet (10 mg total) by mouth daily. 90 tablet 3  . losartan-hydrochlorothiazide (HYZAAR) 100-25 MG tablet Take 1 tablet by mouth daily. 90 tablet 1  . metoprolol succinate (TOPROL-XL) 100 MG 24 hr tablet Take 1 tablet (100 mg total) by mouth daily. Take with or immediately following a meal. 90 tablet 1  . sildenafil (REVATIO) 20 MG tablet Take 1-5 tablets (20-100 mg total) by mouth as needed. MDD: 100 mg 50 tablet 11   No current facility-administered medications for this visit.    No Known Allergies   Past Medical History:  Diagnosis Date  . CAD (coronary artery disease)   . Heart attack ([facility])   . Hyperlipidemia   . Hypertension     Past Surgical History:  Procedure Laterality Date  . NO PAST SURGERIES      Social History   Socioeconomic History  . Marital status: Married    Spouse name: Not on file  . Number of children: Not on file  . Years of education: Not on file  . Highest education level: Not on file  Occupational History  . Not on file  Tobacco Use  . Smoking status: Current Every Day Smoker    Packs/day: 1.00    Years: 30.00    Pack years: 30.00  . Smokeless tobacco: Never Used  Vaping  Use  . Vaping Use: Never used  Substance and Sexual Activity  . Alcohol use: Yes    Alcohol/week: 10.0 - 15.0 standard drinks    Types: 10 - 15 Shots of liquor per week    Comment: 1-2 drinks per night  . Drug use: Never  . Sexual activity: Yes    Partners: Female    Birth control/protection: None  Other Topics Concern  . Not on file  Social History Narrative  . Not on file   Social Determinants of Health   Financial Resource Strain: Not on file  Food Insecurity: Not on file  Transportation Needs: Not on file  Physical Activity: Not on file  Stress: Not on file  Social Connections: Not on file  Intimate Partner Violence: Not on file    Family History  Problem Relation Age of Onset  . Hypertension Mother   . Diabetes Father   . Heart failure Father   . Heart attack Father   . Leukemia Maternal Grandmother   . Prostate cancer Maternal Grandfather     ROS: no fevers or chills, productive cough, hemoptysis, dysphasia, odynophagia, melena, hematochezia, dysuria, hematuria, rash, seizure activity, orthopnea, PND, pedal edema, claudication. Remaining systems are negative.  Physical Exam:   Blood pressure (!) 150/88, pulse 99, height 5\' 7"  (1.702 m), weight 222 lb 12.8 oz (101.1 kg).  General:  Well developed/well nourished in NAD Skin warm/dry Patient not depressed No  peripheral clubbing Back-normal HEENT-normal/normal eyelids Neck supple/normal carotid upstroke bilaterally; no bruits; no JVD; no thyromegaly chest - CTA/ normal expansion CV - RRR/normal S1 and S2; no murmurs, rubs or gallops;  PMI nondisplaced Abdomen -NT/ND, no HSM, no mass, + bowel sounds, no bruit 2+ femoral pulses, no bruits Ext-no edema, chords, 2+ DP Neuro-grossly nonfocal  ECG -normal sinus rhythm at a rate of 99, left ventricular hypertrophy, nonspecific ST changes.  Personally reviewed  A/P  1 coronary artery disease-patient denies chest pain.  Plan to continue medical therapy.  Continue  aspirin.  He is intolerant to statins.    2 hypertension-blood pressure is elevated.  Add amlodipine 5 mg daily and adjust regimen as needed.  3 hyperlipidemia-patient is intolerant to statins due to myalgias.  Continue Zetia.  Check lipids and liver.  If LDL greater than 70 will refer to lipid clinic for consideration of Repatha or Praluent.  4 tobacco abuse-patient counseled on discontinuing.  Olga Millers, MD

## 2021-05-15 ENCOUNTER — Encounter: Payer: Self-pay | Admitting: Cardiology

## 2021-05-15 ENCOUNTER — Ambulatory Visit: Payer: Commercial Managed Care - PPO | Admitting: Cardiology

## 2021-05-15 ENCOUNTER — Other Ambulatory Visit: Payer: Self-pay | Admitting: Medical-Surgical

## 2021-05-15 ENCOUNTER — Other Ambulatory Visit: Payer: Self-pay

## 2021-05-15 VITALS — BP 150/88 | HR 99 | Ht 67.0 in | Wt 222.8 lb

## 2021-05-15 DIAGNOSIS — I2510 Atherosclerotic heart disease of native coronary artery without angina pectoris: Secondary | ICD-10-CM

## 2021-05-15 DIAGNOSIS — I10 Essential (primary) hypertension: Secondary | ICD-10-CM

## 2021-05-15 DIAGNOSIS — E785 Hyperlipidemia, unspecified: Secondary | ICD-10-CM

## 2021-05-15 MED ORDER — AMLODIPINE BESYLATE 5 MG PO TABS: 5.0000 mg | ORAL_TABLET | Freq: Every day | ORAL | 3 refills | Status: DC

## 2021-05-15 NOTE — Patient Instructions (Signed)
Medication Instructions:   START AMLODIPINE 5 MG ONCE DAILY  *If you need a refill on your cardiac medications before your next appointment, please call your pharmacy*   Lab Work:  Your physician recommends that you return for lab work FASTING  If you have labs (blood work) drawn today and your tests are completely normal, you will receive your results only by: Marland Kitchen MyChart Message (if you have MyChart) OR . A paper copy in the mail If you have any lab test that is abnormal or we need to change your treatment, we will call you to review the results.   Follow-Up: At [facility], you and your health needs are our priority.  As part of our continuing mission to provide you with exceptional heart care, we have created designated Provider Care Teams.  These Care Teams include your primary Cardiologist (physician) and Advanced Practice Providers (APPs -  Physician Assistants and Nurse Practitioners) who all work together to provide you with the care you need, when you need it.  We recommend signing up for the patient portal called "MyChart".  Sign up information is provided on this After Visit Summary.  MyChart is used to connect with patients for Virtual Visits (Telemedicine).  Patients are able to view lab/test results, encounter notes, upcoming appointments, etc.  Non-urgent messages can be sent to your provider as well.   To learn more about what you can do with MyChart, go to [email or web address].    Your next appointment:   12 month(s)  The format for your next appointment:   In Person  Provider:   Olga Millers MD

## 2021-05-23 ENCOUNTER — Telehealth: Payer: Self-pay | Admitting: Neurology

## 2021-05-23 NOTE — Telephone Encounter (Signed)
Prior Authorization for Sildenafil Citrate 20MG  tablets submitted via covermymeds. Awaiting response.

## 2021-05-30 ENCOUNTER — Telehealth: Payer: Self-pay

## 2021-05-30 NOTE — Telephone Encounter (Signed)
Sildenafil is the generic for Viagra so not sure what needs to be changed. Patient is overdue for follow up and needs an appointment.

## 2021-05-30 NOTE — Telephone Encounter (Signed)
[facility] pharmacy called and asked for the Dx code for the Sildenafil Rx. They want to know if it's for ED or pulmonary hypertension. They said if it's for pulmonary hypertension they can fill it. They said that if it's not for hypertension they cannot fill it and he will need a Rx for Viagra or the generic Viagra. Please advise, thanks.

## 2021-05-30 NOTE — Telephone Encounter (Signed)
Will advise pt to make appointment. Thanks!

## 2021-07-18 ENCOUNTER — Other Ambulatory Visit: Payer: Self-pay

## 2021-07-18 ENCOUNTER — Emergency Department
Admission: EM | Admit: 2021-07-18 | Discharge: 2021-07-18 | Disposition: A | Discharge status: Home / Self Care | Payer: Commercial Managed Care - PPO | Source: Home / Self Care

## 2021-07-18 DIAGNOSIS — W57XXXA Bitten or stung by nonvenomous insect and other nonvenomous arthropods, initial encounter: Secondary | ICD-10-CM | POA: Diagnosis not present

## 2021-07-18 DIAGNOSIS — R21 Rash and other nonspecific skin eruption: Secondary | ICD-10-CM | POA: Diagnosis not present

## 2021-07-18 DIAGNOSIS — R509 Fever, unspecified: Secondary | ICD-10-CM

## 2021-07-18 DIAGNOSIS — R5383 Other fatigue: Secondary | ICD-10-CM | POA: Diagnosis not present

## 2021-07-18 MED ORDER — AMOXICILLIN-POT CLAVULANATE 875-125 MG PO TABS: 1.0000 | ORAL_TABLET | Freq: Two times a day (BID) | ORAL | 0 refills | Status: AC

## 2021-07-18 NOTE — ED Triage Notes (Signed)
Pt seen in UC w/ c/o a worsening insect bite. Pt states he was bit Tuesday night by an unknown insect. Pt states he has began having body aches and fatigue

## 2021-07-18 NOTE — Discharge Instructions (Addendum)
Advised patient to take medication as directed with food to completion encourage patient increase daily water intake while taking this medication.  Advised patient we will follow-up with him once lab results return.

## 2021-07-18 NOTE — ED Provider Notes (Signed)
[facility]    CSN: [id number] Arrival date & time: 07/18/21  1340      History   Chief Complaint Chief Complaint  Patient presents with   Insect Bite    Lt forearm    Generalized Body Aches   Fatigue    HPI Wesley Duran is a 50 y.o. male.   HPI 50 year old male presents with insect bite of left forearm worsening over the past 2 nights, reports is unsure what bit him. Additionally, patient states having body aches and fatigue that started today.  Past Medical History:  Diagnosis Date   CAD (coronary artery disease)    Heart attack (HCC)    Hyperlipidemia    Hypertension     There are no problems to display for this patient.   Past Surgical History:  Procedure Laterality Date   NO PAST SURGERIES         Home Medications    Prior to Admission medications   Medication Sig Start Date End Date Taking? Authorizing Provider  amoxicillin-clavulanate (AUGMENTIN) 875-125 MG tablet Take 1 tablet by mouth every 12 (twelve) hours for 5 days. 07/18/21 07/23/21 Yes Trevor Iha, FNP  amLODipine (NORVASC) 5 MG tablet Take 1 tablet (5 mg total) by mouth daily. 05/15/21 08/13/21  Lewayne Bunting, MD  ezetimibe (ZETIA) 10 MG tablet Take 1 tablet (10 mg total) by mouth daily. Patient not taking: Reported on 07/18/2021 10/16/20   Christen Butter, NP  losartan-hydrochlorothiazide (HYZAAR) 100-25 MG tablet Take 1 tablet by mouth daily. 10/16/20   Christen Butter, NP  metoprolol succinate (TOPROL-XL) 100 MG 24 hr tablet Take 1 tablet (100 mg total) by mouth daily. Take with or immediately following a meal. 10/16/20   Christen Butter, NP  sildenafil (REVATIO) 20 MG tablet Take 1-5 tablets (20-100 mg total) by mouth as needed. MDD: 100 mg 10/18/20   Christen Butter, NP    Family History Family History  Problem Relation Age of Onset   Hypertension Mother    Diabetes Father    Heart failure Father    Heart attack Father    Leukemia Maternal Grandmother    Prostate cancer Maternal  Grandfather     Social History Social History   Tobacco Use   Smoking status: Every Day    Packs/day: 1.00    Years: 30.00    Pack years: 30.00    Types: Cigarettes   Smokeless tobacco: Never  Vaping Use   Vaping Use: Never used  Substance Use Topics   Alcohol use: Yes    Alcohol/week: 10.0 - 15.0 standard drinks    Types: 10 - 15 Shots of liquor per week    Comment: 1-2 drinks per night   Drug use: Never     Allergies   Patient has no known allergies.   Review of Systems Review of Systems  Constitutional:  Positive for fatigue and fever.  Musculoskeletal:        Myalgias 1-2 days  Skin:  Positive for rash.    Physical Exam Triage Vital Signs ED Triage Vitals  Enc Vitals Group     BP 07/18/21 1348 (!) 138/94     Pulse Rate 07/18/21 1348 93     Resp 07/18/21 1348 14     Temp 07/18/21 1348 99.4 F (37.4 C)     Temp Source 07/18/21 1348 Oral     SpO2 07/18/21 1348 97 %     Weight 07/18/21 1351 220 lb (99.8 kg)     Height  07/18/21 1351 5\' 7"  (1.702 m)     Head Circumference --      Peak Flow --      Pain Score 07/18/21 1349 3     Pain Loc --      Pain Edu? --      Excl. in GC? --    No data found.  Updated Vital Signs BP (!) 138/94 (BP Location: Right Arm)   Pulse 93   Temp 99.4 F (37.4 C) (Oral)   Resp 14   Ht 5\' 7"  (1.702 m)   Wt 220 lb (99.8 kg)   SpO2 97%   BMI 34.46 kg/m    Physical Exam Vitals reviewed.  Constitutional:      General: He is not in acute distress.    Appearance: Normal appearance. He is normal weight. He is not ill-appearing.  HENT:     Head: Normocephalic and atraumatic.     Mouth/Throat:     Mouth: Mucous membranes are moist.     Pharynx: Oropharynx is clear.  Eyes:     Extraocular Movements: Extraocular movements intact.     Conjunctiva/sclera: Conjunctivae normal.     Pupils: Pupils are equal, round, and reactive to light.  Cardiovascular:     Rate and Rhythm: Normal rate and regular rhythm.     Pulses:  Normal pulses.     Heart sounds: Normal heart sounds.  Pulmonary:     Effort: Pulmonary effort is normal.     Breath sounds: Rhonchi present. No wheezing or rales.  Musculoskeletal:        General: Normal range of motion.     Cervical back: Normal range of motion and neck supple. No tenderness.  Lymphadenopathy:     Cervical: No cervical adenopathy.  Skin:    General: Skin is warm and dry.     Comments: Left lower arm (mid anterior aspect): 6.0 cm x 6.0 cm circular shaped indurated and fluctuant erythematous patch, central pustular lesion noted, no discharge/drainage no lymphatic streaking.  Neurological:     General: No focal deficit present.     Mental Status: He is alert and oriented to person, place, and time.  Psychiatric:        Mood and Affect: Mood normal.        Behavior: Behavior normal.     UC Treatments / Results  Labs (all labs ordered are listed, but only abnormal results are displayed) Labs Reviewed  COVID-19, FLU A+B NAA    EKG   Radiology No results found.  Procedures Procedures (including critical care time)  Medications Ordered in UC Medications - No data to display  Initial Impression / Assessment and Plan / UC Course  I have reviewed the triage vital signs and the nursing notes.  Pertinent labs & imaging results that were available during my care of the patient were reviewed by me and considered in my medical decision making (see chart for details).    MDM: 1.  Bug bite with infection, initial encounter-Rx'd Augmentin, 2.  Fever unspecified-COVID-19 flu AMB ordered.  Patient discharged home, hemodynamically stable. Final Clinical Impressions(s) / UC Diagnoses   Final diagnoses:  Fever, unspecified  Bug bite with infection, initial encounter     Discharge Instructions      Advised patient to take medication as directed with food to completion encourage patient increase daily water intake while taking this medication.  Advised patient we  will follow-up with him once lab results return.     ED  Prescriptions     Medication Sig Dispense Auth. Provider   amoxicillin-clavulanate (AUGMENTIN) 875-125 MG tablet Take 1 tablet by mouth every 12 (twelve) hours for 5 days. 10 tablet Trevor Iha, FNP      PDMP not reviewed this encounter.   Trevor Iha, FNP 07/18/21 1524

## 2021-07-20 LAB — COVID-19, FLU A+B NAA
Influenza A, NAA: NOT DETECTED
Influenza B, NAA: NOT DETECTED
SARS-CoV-2, NAA: NOT DETECTED

## 2021-08-27 ENCOUNTER — Encounter: Payer: Self-pay | Admitting: Medical-Surgical

## 2021-08-27 ENCOUNTER — Ambulatory Visit (INDEPENDENT_AMBULATORY_CARE_PROVIDER_SITE_OTHER): Payer: Self-pay | Admitting: Medical-Surgical

## 2021-08-27 ENCOUNTER — Other Ambulatory Visit: Payer: Self-pay

## 2021-08-27 VITALS — BP 151/89 | HR 82 | Temp 98.4°F | Ht 67.0 in | Wt 230.6 lb

## 2021-08-27 DIAGNOSIS — Z1329 Encounter for screening for other suspected endocrine disorder: Secondary | ICD-10-CM

## 2021-08-27 DIAGNOSIS — N522 Drug-induced erectile dysfunction: Secondary | ICD-10-CM

## 2021-08-27 DIAGNOSIS — I10 Essential (primary) hypertension: Secondary | ICD-10-CM

## 2021-08-27 DIAGNOSIS — Z6836 Body mass index (BMI) 36.0-36.9, adult: Secondary | ICD-10-CM

## 2021-08-27 DIAGNOSIS — E7849 Other hyperlipidemia: Secondary | ICD-10-CM

## 2021-08-27 DIAGNOSIS — M5412 Radiculopathy, cervical region: Secondary | ICD-10-CM

## 2021-08-27 DIAGNOSIS — Z131 Encounter for screening for diabetes mellitus: Secondary | ICD-10-CM

## 2021-08-27 DIAGNOSIS — M25541 Pain in joints of right hand: Secondary | ICD-10-CM

## 2021-08-27 DIAGNOSIS — G6289 Other specified polyneuropathies: Secondary | ICD-10-CM

## 2021-08-27 MED ORDER — GABAPENTIN 100 MG PO CAPS: 100.0000 mg | ORAL_CAPSULE | Freq: Three times a day (TID) | ORAL | 3 refills | Status: DC

## 2021-08-27 MED ORDER — EZETIMIBE 10 MG PO TABS: 10.0000 mg | ORAL_TABLET | Freq: Every day | ORAL | 3 refills | Status: DC

## 2021-08-27 NOTE — Progress Notes (Signed)
HPI with pertinent ROS:   CC: hand pain  HPI: Wesley Duran 50 year old male presenting today for evaluation of bilateral hand pain, right greater than left as well as numbness and tingling of the right upper extremity.  Over the last 2 months, has noted difficulties with his hand movements, aching pain in his hand and numbness and tingling extending from his shoulder to his wrist.  He also has some significant neck pain, right greater than left.  Has noted large nodules on the right thumb as well as the right middle finger.  His job is very hand oriented and he is having to lift and grab frequently.  He has had no known injuries or falls.  He has been using ibuprofen 800 mg twice daily as well as heat or ice with minimal relief.  Unfortunately, he lost his job yesterday and is currently uninsured.  Cost will be a consideration in his work-up and diagnosis.  Hypertension-has seen cardiology and was started on amlodipine.  Blood pressure still elevated today 151/89.  He is taking his medicines as prescribed however he is not monitoring his blood pressure at home.  He and his wife recently moved to [facility] and he has been very busy with the moving process.  Does have a history of erectile dysfunction.  Is currently taking sildenafil that he got from an [employer].  Using the medication as needed.  Hyperlipidemia-statin intolerant due to myalgias.  Last visit with cardiology recommended continuing Zetia 10 mg daily.  Unfortunately this was not sent to his pharmacy so he has not been taking it.  Notes that he does have bilateral lower extremity neuropathy that can get pretty bad at times.  I reviewed the past medical history, family history, social history, surgical history, and allergies today and no changes were needed.  Please see the problem list section below in epic for further details.   Physical exam:   General: Well Developed, well nourished, and in no acute distress.  Neuro: Alert  and oriented x3.  HEENT: Normocephalic, atraumatic.  Skin: Warm and dry. Cardiac: Regular rate and rhythm, no murmurs rubs or gallops, no lower extremity edema.  Respiratory: Clear to auscultation bilaterally. Not using accessory muscles, speaking in full sentences. RUE: Thumb flexion limited by large hard nodule on the dorsal side of the IP joint. Limited range of motion and grip slightly weak in part due to pain. No ulnar deviation of digits. Third finger PIP joint nodule noted. No redness or swelling.   Impression and Recommendations:    1. Joint pain in fingers of right hand 2. Other polyneuropathy Getting right hand x-rays today.  Also checking ANA, ESR, CRP, and rheumatoid factor.  Lab slip given to patient and once he has insurance coverage, he will have these done. - DG Hand Complete Right; Future - High sensitivity CRP - Rheumatoid factor - Sedimentation rate - ANA,IFA RA Diag Pnl w/rflx Tit/Patn  3. Cervical radiculopathy Strongly suspect numbness and tingling of the right arm are related to a cervical etiology.  Getting C-spine x-rays today. - DG Cervical Spine Complete; Future  4. Essential hypertension Checking CBC, CMP, and lipid panel today.  Continue medications as prescribed by cardiology.  Work on exercise, weight loss, and home monitoring of blood pressure. - CBC with Differential/Platelet - COMPLETE METABOLIC PANEL WITH GFR - Lipid panel  5. Drug-induced erectile dysfunction Continue sildenafil.  If he would like [place] to prescribe it from our clinic, he will let us know.  6. Familial  hyperlipidemia Checking lipid panel today.  Restart Zetia 10 mg, refill sent to pharmacy. - Lipid panel  7. Thyroid disorder screen Checking TSH. - TSH  8. Class 2 severe obesity due to excess calories with serious comorbidity and body mass index (BMI) of 36.0 to 36.9 in adult [facility]) Checking hemoglobin A1c. - Hemoglobin A1c  9. Diabetes mellitus screening Checking hemoglobin  A1c. - Hemoglobin A1c   Return if symptoms worsen or fail to improve. ___________________________________________ Clearnce Sorrel, DNP, APRN, FNP-BC Primary [facility] and [place]

## 2021-09-11 ENCOUNTER — Other Ambulatory Visit: Payer: Self-pay | Admitting: Medical-Surgical

## 2021-09-11 ENCOUNTER — Encounter: Payer: Self-pay | Admitting: Medical-Surgical

## 2021-09-11 MED ORDER — PREGABALIN 50 MG PO CAPS: 50.0000 mg | ORAL_CAPSULE | Freq: Two times a day (BID) | ORAL | 0 refills | Status: DC

## 2021-10-01 ENCOUNTER — Encounter: Payer: Self-pay | Admitting: Medical-Surgical

## 2021-10-02 ENCOUNTER — Ambulatory Visit (INDEPENDENT_AMBULATORY_CARE_PROVIDER_SITE_OTHER): Payer: Commercial Managed Care - PPO

## 2021-10-02 ENCOUNTER — Other Ambulatory Visit: Payer: Self-pay

## 2021-10-02 DIAGNOSIS — M5412 Radiculopathy, cervical region: Secondary | ICD-10-CM

## 2021-10-02 DIAGNOSIS — M25541 Pain in joints of right hand: Secondary | ICD-10-CM

## 2021-10-03 ENCOUNTER — Other Ambulatory Visit: Payer: Self-pay

## 2021-10-03 ENCOUNTER — Encounter: Payer: Self-pay | Admitting: Medical-Surgical

## 2021-10-03 ENCOUNTER — Other Ambulatory Visit: Payer: Self-pay | Admitting: Medical-Surgical

## 2021-10-03 DIAGNOSIS — E781 Pure hyperglyceridemia: Secondary | ICD-10-CM

## 2021-10-03 DIAGNOSIS — I10 Essential (primary) hypertension: Secondary | ICD-10-CM

## 2021-10-03 DIAGNOSIS — E7849 Other hyperlipidemia: Secondary | ICD-10-CM

## 2021-10-03 MED ORDER — CELECOXIB 200 MG PO CAPS: ORAL_CAPSULE | ORAL | 2 refills | Status: DC

## 2021-10-03 MED ORDER — AMLODIPINE BESYLATE 5 MG PO TABS: 5.0000 mg | ORAL_TABLET | Freq: Every day | ORAL | 1 refills | Status: DC

## 2021-10-04 LAB — COMPLETE METABOLIC PANEL WITH GFR
AG Ratio: 1.7 (calc) (ref 1.0–2.5)
ALT: 70 U/L — ABNORMAL HIGH (ref 9–46)
AST: 33 U/L (ref 10–40)
Albumin: 4.7 g/dL (ref 3.6–5.1)
Alkaline phosphatase (APISO): 52 U/L (ref 36–130)
BUN: 15 mg/dL (ref 7–25)
CO2: 28 mmol/L (ref 20–32)
Calcium: 9.8 mg/dL (ref 8.6–10.3)
Chloride: 97 mmol/L — ABNORMAL LOW (ref 98–110)
Creat: 0.8 mg/dL (ref 0.60–1.29)
Globulin: 2.8 g/dL (calc) (ref 1.9–3.7)
Glucose, Bld: 100 mg/dL — ABNORMAL HIGH (ref 65–99)
Potassium: 5 mmol/L (ref 3.5–5.3)
Sodium: 136 mmol/L (ref 135–146)
Total Bilirubin: 0.5 mg/dL (ref 0.2–1.2)
Total Protein: 7.5 g/dL (ref 6.1–8.1)
eGFR: 108 mL/min/{1.73_m2} (ref >=60)

## 2021-10-04 LAB — CBC WITH DIFFERENTIAL/PLATELET
Absolute Monocytes: 901 cells/uL (ref 200–950)
Basophils Absolute: 69 cells/uL (ref 0–200)
Basophils Relative: 0.7 %
Eosinophils Absolute: 396 cells/uL (ref 15–500)
Eosinophils Relative: 4 %
HCT: 44.9 % (ref 38.5–50.0)
Hemoglobin: 15.1 g/dL (ref 13.2–17.1)
Lymphs Abs: 2911 cells/uL (ref 850–3900)
MCH: 30.9 pg (ref 27.0–33.0)
MCHC: 33.6 g/dL (ref 32.0–36.0)
MCV: 91.8 fL (ref 80.0–100.0)
MPV: 9.6 fL (ref 7.5–12.5)
Monocytes Relative: 9.1 %
Neutro Abs: 5623 cells/uL (ref 1500–7800)
Neutrophils Relative %: 56.8 %
Platelets: 312 10*3/uL (ref 140–400)
RBC: 4.89 10*6/uL (ref 4.20–5.80)
RDW: 12.8 % (ref 11.0–15.0)
Total Lymphocyte: 29.4 %
WBC: 9.9 10*3/uL (ref 3.8–10.8)

## 2021-10-04 LAB — HEMOGLOBIN A1C
Hgb A1c MFr Bld: 5.9 % of total Hgb — ABNORMAL HIGH (ref <5.7)
Mean Plasma Glucose: 123 mg/dL
eAG (mmol/L): 6.8 mmol/L

## 2021-10-04 LAB — TSH: TSH: 1.64 mIU/L (ref 0.40–4.50)

## 2021-10-04 LAB — LIPID PANEL
Cholesterol: 279 mg/dL — ABNORMAL HIGH (ref <200)
HDL: 38 mg/dL — ABNORMAL LOW (ref >=40)
Non-HDL Cholesterol (Calc): 241 mg/dL (calc) — ABNORMAL HIGH (ref <130)
Total CHOL/HDL Ratio: 7.3 (calc) — ABNORMAL HIGH (ref <5.0)
Triglycerides: 668 mg/dL — ABNORMAL HIGH (ref <150)

## 2021-10-04 LAB — HIGH SENSITIVITY CRP: hs-CRP: 2.8 mg/L

## 2021-10-04 LAB — ANA,IFA RA DIAG PNL W/RFLX TIT/PATN
Anti Nuclear Antibody (ANA): NEGATIVE
Cyclic Citrullin Peptide Ab: <16 UNITS
Rheumatoid fact SerPl-aCnc: <14 IU/mL (ref <14)

## 2021-10-04 LAB — SEDIMENTATION RATE: Sed Rate: 6 mm/h (ref 0–15)

## 2021-10-15 ENCOUNTER — Other Ambulatory Visit: Payer: Self-pay

## 2021-10-15 DIAGNOSIS — I10 Essential (primary) hypertension: Secondary | ICD-10-CM

## 2021-10-15 MED ORDER — METOPROLOL SUCCINATE ER 100 MG PO TB24
100.0000 mg | ORAL_TABLET | Freq: Every day | ORAL | 1 refills | Status: DC
Start: 2021-10-15 — End: 2022-04-07

## 2021-10-15 MED ORDER — LOSARTAN POTASSIUM-HCTZ 100-25 MG PO TABS: 1.0000 | ORAL_TABLET | Freq: Every day | ORAL | 1 refills | Status: DC

## 2021-10-18 ENCOUNTER — Other Ambulatory Visit: Payer: Self-pay | Admitting: Medical-Surgical

## 2021-10-18 MED ORDER — SILDENAFIL CITRATE 20 MG PO TABS: 20.0000 mg | ORAL_TABLET | ORAL | 11 refills | Status: DC | PRN

## 2021-10-22 ENCOUNTER — Encounter: Payer: Self-pay | Admitting: Medical-Surgical

## 2021-10-22 ENCOUNTER — Telehealth (INDEPENDENT_AMBULATORY_CARE_PROVIDER_SITE_OTHER): Payer: Commercial Managed Care - PPO | Admitting: Medical-Surgical

## 2021-10-22 DIAGNOSIS — G479 Sleep disorder, unspecified: Secondary | ICD-10-CM | POA: Diagnosis not present

## 2021-10-22 DIAGNOSIS — Z712 Person consulting for explanation of examination or test findings: Secondary | ICD-10-CM | POA: Diagnosis not present

## 2021-10-22 DIAGNOSIS — M25541 Pain in joints of right hand: Secondary | ICD-10-CM | POA: Diagnosis not present

## 2021-10-22 MED ORDER — AZELASTINE-FLUTICASONE 137-50 MCG/ACT NA SUSP: 1.0000 | Freq: Two times a day (BID) | NASAL | 0 refills | Status: DC

## 2021-10-22 MED ORDER — TRAZODONE HCL 50 MG PO TABS: 25.0000 mg | ORAL_TABLET | Freq: Every evening | ORAL | 3 refills | Status: DC | PRN

## 2021-10-22 NOTE — Progress Notes (Signed)
Virtual Visit via Video Note  I connected with Wesley Duran on 10/22/21 at  1:00 PM EDT by a video enabled telemedicine application and verified that I am speaking with the correct person using two identifiers.   I discussed the limitations of evaluation and management by telemedicine and the availability of in person appointments. The patient expressed understanding and agreed to proceed.  Patient location: home Provider locations: office  Subjective:    CC: review lab results  HPI: Pleasant 50 year old male presenting today via [place] video visit to discuss his recent lab work and imaging results.  Was recently evaluated for autoimmune issues due to swelling and stiffness in his hands.  His ANA, rheumatoid factor, ESR, and CRP were all negative but his right hand x-ray did show some early degeneration at the first IP joint.  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  He is extremely worried about going back since he is still having significant pain and stiffness in his hands.  He has noted more growth of the bones on his knuckles.  Notes that after sitting and typing for a while, his right arm is "shot".  Also notes that he is having significant back pain.  Years ago he was told he had a bulging disc at T11-T12 and wonders if this may be responsible for the numbness and tingling in his legs.  He has been trying to increase his exercise but is only able to make it approximately one half of a mile prior to having to stop.  His wife did join [place] on the call with reports that the patient is having difficulty with sleeping.  He is supposed to use a CPAP but tries to avoid it whenever possible.  They have gotten the mattress that allows for raising the head of the bed which is helping some.  She notes that he is snoring more often rather than having apneic episodes.  He reports that he has always had sinus issues and at night, the congestion can be  overwhelming causing him to wake up.  He is taking Allegra but notes that his allergies are always worse in the fall.  Not currently using nasal sprays but does note that he has used Afrin before and experiences severe rebound congestion after only 24 hours of use.  Has tried using Unisom to help with sleep and congestion but this was not helpful.   Past medical history, Surgical history, Family history not pertinant except as noted below, Social history, Allergies, and medications have been entered into the medical record, reviewed, and corrections made.   Review of Systems: See HPI for pertinent positives and negatives.   Objective:    General: Speaking clearly in complete sentences without any shortness of breath.  Alert and oriented x3.  Normal judgment. No apparent acute distress.  Impression and Recommendations:    1. Encounter to discuss test results Reviewed results of labs and imaging.  All questions answered.  2. Joint pain in fingers of right hand With his progressive joint, would recommend he get into see Dr. Darene Lamer here in our office for further evaluation.  May benefit from referral to rheumatology so putting that in today but this will likely take several weeks and he should be able to see Dr. Darene Lamer prior to that.  3. Trouble in sleeping Recommend continuing [place] but add in azelastine-fluticasone nasal spray twice daily to combat congestion.  Sending in trazodone 25 mg - 50 mg nightly as needed for sleep.  I discussed the assessment and treatment plan with the patient. The patient was provided an opportunity to ask questions and all were answered. The patient agreed with the plan and demonstrated an understanding of the instructions.   The patient was advised to call back or seek an in-person evaluation if the symptoms worsen or if the condition fails to improve as anticipated.  25 minutes of non-face-to-face time was provided during this encounter.  Return if symptoms worsen or  fail to improve.  Clearnce Sorrel, DNP, APRN, FNP-BC [facility]

## 2021-10-22 NOTE — Progress Notes (Signed)
Labs and imaging having to do with his arthritis.  Results from 10/5.  Hasn't been working since August, arthritis not hurting as much but seeing additional bone growth.

## 2021-10-25 ENCOUNTER — Telehealth: Payer: Self-pay

## 2021-10-25 NOTE — Telephone Encounter (Signed)
Medication: sildenafil (REVATIO) 20 MG tablet Prior authorization submitted via CoverMyMeds on 10/25/2021 [place] submission pending

## 2021-10-29 ENCOUNTER — Ambulatory Visit (INDEPENDENT_AMBULATORY_CARE_PROVIDER_SITE_OTHER): Payer: Commercial Managed Care - PPO | Admitting: Sports Medicine

## 2021-10-29 ENCOUNTER — Encounter: Payer: Self-pay | Admitting: Medical-Surgical

## 2021-10-29 ENCOUNTER — Other Ambulatory Visit: Payer: Self-pay

## 2021-10-29 ENCOUNTER — Ambulatory Visit (INDEPENDENT_AMBULATORY_CARE_PROVIDER_SITE_OTHER): Payer: Commercial Managed Care - PPO

## 2021-10-29 DIAGNOSIS — G8929 Other chronic pain: Secondary | ICD-10-CM | POA: Diagnosis not present

## 2021-10-29 DIAGNOSIS — M545 Low back pain, unspecified: Secondary | ICD-10-CM | POA: Diagnosis not present

## 2021-10-29 DIAGNOSIS — M19041 Primary osteoarthritis, right hand: Secondary | ICD-10-CM | POA: Insufficient documentation

## 2021-10-29 DIAGNOSIS — M19042 Primary osteoarthritis, left hand: Secondary | ICD-10-CM

## 2021-10-29 DIAGNOSIS — M5412 Radiculopathy, cervical region: Secondary | ICD-10-CM | POA: Diagnosis not present

## 2021-10-29 DIAGNOSIS — M5136 Other intervertebral disc degeneration, lumbar region: Secondary | ICD-10-CM

## 2021-10-29 DIAGNOSIS — M51369 Other intervertebral disc degeneration, lumbar region without mention of lumbar back pain or lower extremity pain: Secondary | ICD-10-CM | POA: Insufficient documentation

## 2021-10-29 DIAGNOSIS — E669 Obesity, unspecified: Secondary | ICD-10-CM | POA: Diagnosis not present

## 2021-10-29 DIAGNOSIS — M5030 Other cervical disc degeneration, unspecified cervical region: Secondary | ICD-10-CM | POA: Insufficient documentation

## 2021-10-29 MED ORDER — PREGABALIN 50 MG PO CAPS: ORAL_CAPSULE | ORAL | 3 refills | Status: DC

## 2021-10-29 MED ORDER — PREDNISONE 50 MG PO TABS: ORAL_TABLET | ORAL | 0 refills | Status: DC

## 2021-10-29 NOTE — Assessment & Plan Note (Signed)
J[place] will work aggressively on his weight, exercise was not really tolerable so I would like him to discuss weight loss medication with his PCP. I think he needs to lose approximately 30 to 50 pounds.

## 2021-10-29 NOTE — Assessment & Plan Note (Signed)
Continue Celebrex, he will also wear his arthritis compression gloves at night. Rheumatoid work-up negative. In the future we may consider carpal tunnel syndrome.

## 2021-10-29 NOTE — Assessment & Plan Note (Signed)
Axial discogenic back pain, occasional radiation down the legs, adding 5 days of prednisone, we will do an up taper on the Lyrica. He will continue Celebrex after prednisone. Adding x-rays, as well as lumbar spine conditioning, if no better in 6 weeks we will do MRI for epidural planning.

## 2021-10-29 NOTE — Assessment & Plan Note (Signed)
X-rays do show C5-C7 DDD, adding cervical spine conditioning, increasing Lyrica, prednisone as above. Turn to see me in 6 weeks, MRI for interventional planning if no better.

## 2021-10-29 NOTE — Progress Notes (Signed)
    Procedures performed today:    None.  Independent interpretation of notes and tests performed by another provider:   None.  Brief History, Exam, Impression, and Recommendations:    Lumbar degenerative disc disease Axial discogenic back pain, occasional radiation down the legs, adding 5 days of prednisone, we will do an up taper on the Lyrica. He will continue Celebrex after prednisone. Adding x-rays, as well as lumbar spine conditioning, if no better in 6 weeks we will do MRI for epidural planning.  Primary osteoarthritis of both hands Continue Celebrex, he will also wear his arthritis compression gloves at night. Rheumatoid work-up negative. In the future we may consider carpal tunnel syndrome.  Radiculitis of right cervical region X-rays do show C5-C7 DDD, adding cervical spine conditioning, increasing Lyrica, prednisone as above. Turn to see me in 6 weeks, MRI for interventional planning if no better.  Obesity (BMI 30-39.9) [place] will work aggressively on his weight, exercise was not really tolerable so I would like him to discuss weight loss medication with his PCP. I think he needs to lose approximately 30 to 50 pounds.    ___________________________________________ Ihor Austin. Benjamin Stain, M.D., ABFM., CAQSM. Primary Care and Sports Medicine [facility]  Adjunct Instructor of Family Medicine  [employer]

## 2021-10-31 NOTE — Telephone Encounter (Signed)
Medication: sildenafil (REVATIO) 20 MG tablet Prior authorization determination received Medication has been denied Reason for denial:  "Treatment of sexual dysfunction is a specific exclusion to the plan"

## 2021-10-31 NOTE — Telephone Encounter (Signed)
Pt aware of denial and recommendation via MyChart message

## 2021-10-31 NOTE — Telephone Encounter (Signed)
Christen Butter, NP  You 49 minutes ago (12:25 PM)   Please notify patient of denial. This will likely be cheaper if he uses [email or web address].   Thanks,  Ander Slade

## 2021-11-01 ENCOUNTER — Encounter (HOSPITAL_BASED_OUTPATIENT_CLINIC_OR_DEPARTMENT_OTHER): Payer: Self-pay | Admitting: Internal Medicine

## 2021-11-01 ENCOUNTER — Telehealth (INDEPENDENT_AMBULATORY_CARE_PROVIDER_SITE_OTHER): Payer: Commercial Managed Care - PPO | Admitting: Internal Medicine

## 2021-11-01 VITALS — BP 173/88 | HR 102 | Ht 67.0 in | Wt 236.0 lb

## 2021-11-01 DIAGNOSIS — E785 Hyperlipidemia, unspecified: Secondary | ICD-10-CM

## 2021-11-01 DIAGNOSIS — E781 Pure hyperglyceridemia: Secondary | ICD-10-CM | POA: Diagnosis not present

## 2021-11-01 DIAGNOSIS — I2510 Atherosclerotic heart disease of native coronary artery without angina pectoris: Secondary | ICD-10-CM | POA: Diagnosis not present

## 2021-11-01 DIAGNOSIS — I10 Essential (primary) hypertension: Secondary | ICD-10-CM

## 2021-11-01 NOTE — Progress Notes (Addendum)
Virtual Visit via Video Note   This visit type was conducted due to national recommendations for restrictions regarding the COVID-19 Pandemic (e.g. social distancing) in an effort to limit this patient's exposure and mitigate transmission in our community.  Due to his co-morbid illnesses, this patient is at least at moderate risk for complications without adequate follow up.  This format is felt to be most appropriate for this patient at this time.  All issues noted in this document were discussed and addressed.  A limited physical exam was performed with this format.  Please refer to the patient's chart for his consent to telehealth for [facility].      Date:  11/01/2021   ID:  Wesley Duran, DOB 25-Sep-1971, MRN [id number] The patient was identified using 2 identifiers.  Evaluation Performed:  New Patient Evaluation  Patient Location:  [address] [place] [address]  Provider location:   [address] [place], [place] [address]  PCP:  Christen Butter, NP  Cardiologist:  None Electrophysiologist:  None   Chief Complaint: Manage dyslipidemia  History of Present Illness:    Wesley Duran is a 50 y.o. male who presents via audio/video conferencing for a telehealth visit today.  This is a pleasant 50 year old male previously seen by Dr. Jens Som earlier this year in [place] with a history of non-ST elevation MI and plain old balloon angioplasty to a branch vessel in 2017.  He denies a prior stent.  He has not had any further chest pain since then.  Other risk factors include dyslipidemia, hypertension, tobacco use and sleep apnea.  More recently he has had dietary indiscretions.  He says he has purchased a smoker and is eating a lot more smoked and fatty meats.  He also reports alcohol use at least several drinks per week.  He said he has been intolerant to statins in the past which both caused myalgias and subsequently has been on ezetimibe.  Recent labs in October  showed total cholesterol 279, HDL 38, triglycerides 668 and LDL was not calculated.  Hemoglobin A1c was 5.9.  He had a discussion with his primary care provider and is made dietary changes, however he will likely need additional medical therapy.  The patient does not have symptoms concerning for COVID-19 infection (fever, chills, cough, or new SHORTNESS OF BREATH).    Prior CV studies:   The following studies were reviewed today:  Reviewed, lab work  PMHx:  Past Medical History:  Diagnosis Date   Arthritis    CAD (coronary artery disease)    Heart attack (HCC)    Hyperlipidemia    Hypertension    Sleep apnea     Past Surgical History:  Procedure Laterality Date   NO PAST SURGERIES      FAMHx:  Family History  Problem Relation Age of Onset   Hypertension Mother    Diabetes Father    Heart failure Father    Heart attack Father    Leukemia Maternal Grandmother    Prostate cancer Maternal Grandfather     SOCHx:   reports that he has been smoking cigarettes. He has a 30.00 pack-year smoking history. He has never used smokeless tobacco. He reports current alcohol use of about 10.0 - 15.0 standard drinks per week. He reports that he does not use drugs.  ALLERGIES:  No Known Allergies  MEDS:  Current Meds  Medication Sig   amLODipine (NORVASC) 5 MG tablet Take 1 tablet (5 mg total) by  mouth daily.   celecoxib (CELEBREX) 200 MG capsule One to 2 tablets by mouth daily as needed for pain.   ezetimibe (ZETIA) 10 MG tablet Take 1 tablet (10 mg total) by mouth daily.   losartan-hydrochlorothiazide (HYZAAR) 100-25 MG tablet Take 1 tablet by mouth daily.   metoprolol succinate (TOPROL-XL) 100 MG 24 hr tablet Take 1 tablet (100 mg total) by mouth daily. Take with or immediately following a meal.   predniSONE (DELTASONE) 50 MG tablet One tab PO daily for 5 days.   pregabalin (LYRICA) 50 MG capsule 1 capsule p.o. twice daily for a week then 2 capsules p.o. twice daily for a week  then 3 capsules p.o. twice daily.   sildenafil (REVATIO) 20 MG tablet Take 1-5 tablets (20-100 mg total) by mouth as needed. MDD: 100 mg   traZODone (DESYREL) 50 MG tablet Take 0.5-1 tablets (25-50 mg total) by mouth at bedtime as needed for sleep.     ROS: Pertinent items noted in HPI and remainder of comprehensive ROS otherwise negative.  Labs/Other Tests and Data Reviewed:    Recent Labs: 10/02/2021: ALT 70; BUN 15; Creat 0.80; Hemoglobin 15.1; Platelets 312; Potassium 5.0; Sodium 136; TSH 1.64   Recent Lipid Panel Lab Results  Component Value Date/Time   CHOL 279 (H) 10/02/2021 08:53 AM   TRIG 668 (H) 10/02/2021 08:53 AM   HDL 38 (L) 10/02/2021 08:53 AM   CHOLHDL 7.3 (H) 10/02/2021 08:53 AM   LDLCALC  10/02/2021 08:53 AM     Comment:     . LDL cholesterol not calculated. Triglyceride levels greater than 400 mg/dL invalidate calculated LDL results. . Reference range: <100 . Desirable range <100 mg/dL for primary prevention;   <70 mg/dL for patients with CHD or diabetic patients  with > or = 2 CHD risk factors. Marland Kitchen LDL-C is now calculated using the Martin-Hopkins  calculation, which is a validated novel method providing  better accuracy than the Friedewald equation in the  estimation of LDL-C.  Horald Pollen et al. Lenox Ahr. 0814;481(85): 2061-2068  (http://education.[facility].com/faq/FAQ164)     Wt Readings from Last 3 Encounters:  11/01/21 236 lb (107 kg)  08/27/21 230 lb 9.6 oz (104.6 kg)  07/18/21 220 lb (99.8 kg)     Exam:    Vital Signs:  BP (!) 173/88   Pulse (!) 102   Ht 5\' 7"  (1.702 m)   Wt 236 lb (107 kg)   BMI 36.96 kg/m    General appearance: alert and no distress Lungs: Visual respiratory difficulty Abdomen: Moderately obese Extremities: extremities normal, atraumatic, no cyanosis or edema Skin: Skin color, texture, turgor normal. 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 Bohr has recently had very high triglycerides a suspect from dietary indiscretions and alcohol use.  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  He is only on ezetimibe because of side effects of the statins in the past including myalgias.  I would like to repeat his labs including a direct LDL and NMR to determine the need for additional therapies.  He would likely benefit from Vascepa 2 g twice daily and we may need to consider PCSK9 inhibitor based on his LDL cholesterol.  He is agreeable with that approach.  I have strongly suggested  alcohol and tobacco cessation and improvements in his dietary plan which we discussed.  Plan and follow-up with me in about 3 to 4 months.  COVID-19 Education: The signs and symptoms of COVID-19 were discussed with the patient and how to seek care for testing (follow up with PCP or arrange E-visit).  The importance of social distancing was discussed today.  Patient Risk:   After full review of this patients clinical status, I feel that they are at least moderate risk at this time.  Time:   Today, I have spent 25 minutes with the patient with telehealth technology discussing dyslipidemia, prior coronary disease history, lifestyle suggestions.     Medication Adjustments/Labs and Tests Ordered: Current medicines are reviewed at length with the patient today.  Concerns regarding medicines are outlined above.   Tests Ordered: No orders of the defined types were placed in this encounter.   Medication Changes: No orders of the defined types were placed in this encounter.   Disposition:  in 4 month(s)  Chrystie Nose, MD, [facility], FACP  [place]  [facility]  Medical Director of the Advanced Lipid Disorders &  Cardiovascular Risk  Reduction Clinic Diplomate of the American Board of Clinical Lipidology Attending Cardiologist  Direct Dial: [phone]  Fax: [phone]  Website:  [email or web address]  Chrystie Nose, MD  11/01/2021 10:48 AM

## 2021-11-01 NOTE — Patient Instructions (Signed)
Medication Instructions:  NO CHANGES today -- will depend on lab results  *If you need a refill on your cardiac medications before your next appointment, please call your pharmacy*   Lab Work: FASTING lab work to check cholesterol -- NMR lipoprofile, direct LDL  If you have labs (blood work) drawn today and your tests are completely normal, you will receive your results only by: MyChart Message (if you have MyChart) OR A paper copy in the mail If you have any lab test that is abnormal or we need to change your treatment, we will call you to review the results.   Testing/Procedures: NONE   Follow-Up: At [facility], you and your health needs are our priority.  As part of our continuing mission to provide you with exceptional heart care, we have created designated Provider Care Teams.  These Care Teams include your primary Cardiologist (physician) and Advanced Practice Providers (APPs -  Physician Assistants and Nurse Practitioners) who all work together to provide you with the care you need, when you need it.  We recommend signing up for the patient portal called "MyChart".  Sign up information is provided on this After Visit Summary.  MyChart is used to connect with patients for Virtual Visits (Telemedicine).  Patients are able to view lab/test results, encounter notes, upcoming appointments, etc.  Non-urgent messages can be sent to your provider as well.   To learn more about what you can do with MyChart, go to [email or web address].    Your next appointment:   4 month(s)  The format for your next appointment:   In Person or Virtual  Provider:   Dr. Zoila Shutter - lipid clinic

## 2021-11-04 ENCOUNTER — Ambulatory Visit (INDEPENDENT_AMBULATORY_CARE_PROVIDER_SITE_OTHER): Payer: Commercial Managed Care - PPO | Admitting: Medical-Surgical

## 2021-11-04 ENCOUNTER — Other Ambulatory Visit: Payer: Self-pay

## 2021-11-04 ENCOUNTER — Encounter: Payer: Self-pay | Admitting: Medical-Surgical

## 2021-11-04 VITALS — BP 145/94 | HR 93 | Temp 98.5°F | Ht 67.0 in | Wt 242.0 lb

## 2021-11-04 DIAGNOSIS — R7303 Prediabetes: Secondary | ICD-10-CM

## 2021-11-04 DIAGNOSIS — E669 Obesity, unspecified: Secondary | ICD-10-CM | POA: Diagnosis not present

## 2021-11-04 DIAGNOSIS — E7849 Other hyperlipidemia: Secondary | ICD-10-CM

## 2021-11-04 DIAGNOSIS — Z23 Encounter for immunization: Secondary | ICD-10-CM

## 2021-11-04 DIAGNOSIS — I10 Essential (primary) hypertension: Secondary | ICD-10-CM | POA: Diagnosis not present

## 2021-11-04 DIAGNOSIS — E8881 Metabolic syndrome: Secondary | ICD-10-CM | POA: Diagnosis not present

## 2021-11-04 MED ORDER — BUPROPION HCL ER (XL) 150 MG PO TB24: ORAL_TABLET | ORAL | 0 refills | Status: DC

## 2021-11-04 NOTE — Progress Notes (Signed)
  HPI with pertinent ROS:   CC: Discuss weight loss medications  HPI: Pleasant 50 year old male presenting today to discuss weight loss medications. He has been seen recently by sports medicine, cardiology, and the advanced hypertension clinic.  Each of these places has mentioned weight loss as a good management tool for his chronic issues of joint pain, hypertension, and hyperlipidemia.  He has now decided to "go hardcore" and make some significant life changes with a goal of losing weight and living a healthier life.  He is planning to continue walking and try to increase his activity as tolerated.  He is also making significant dietary modifications to cut out flowers, sugars, other starches, and fats.  Is interested in what medications may be available to help with his journey.  I reviewed the past medical history, family history, social history, surgical history, and allergies today and no changes were needed.  Please see the problem list section below in epic for further details.   Physical exam:   General: Well Developed, well nourished, and in no acute distress.  Neuro: Alert and oriented x3.  HEENT: Normocephalic, atraumatic.  Skin: Warm and dry. Cardiac: Regular rate and rhythm, no murmurs rubs or gallops, no lower extremity edema.  Respiratory: Clear to auscultation bilaterally. Not using accessory muscles, speaking in full sentences.  Impression and Recommendations:    1. Obesity (BMI 30-39.9) 2. Prediabetes 3. Abdominal obesity and metabolic syndrome 4. Familial hyperlipidemia 5. Essential hypertension Agree that weight loss would ultimately be beneficial.  Discussed various options available for weight loss.  Unfortunately, with his cardiac issues, he is not a candidate for anything containing phentermine.  He is also less than excited about the idea of doing an injection so would like to avoid Saxenda and Ozempic/Wegovy for now.  May be open to this later.  Discussed options  specifically what for weight loss as well as off label use.  He would like to start with Wellbutrin 150 mg daily for 2 weeks then increase to 300 mg daily.  This will likely help with his smoking cessation as well. Discussed dietary modification and exercise recommendations.   6. Need for influenza vaccination Flu vaccine given in office.  - Flu Vaccine QUAD 64mo+IM (Fluarix, Fluzone & Alfiuria Quad PF)  Return in about 3 months (around 02/04/2022) for weight check. ___________________________________________ Thayer Ohm, DNP, APRN, FNP-BC Primary Care and Sports Medicine [facility]

## 2021-11-04 NOTE — Patient Instructions (Signed)
Influenza (Flu) Vaccine (Inactivated or Recombinant): What You Need to Know 1. Why get vaccinated? Influenza vaccine can prevent influenza (flu). Flu is a contagious disease that spreads around the [place] every year, usually between October and May. Anyone can get the flu, but it is more dangerous for some people. Infants and young children, people 65 years and older, pregnant people, and people with certain health conditions or a weakened immune system are at greatest risk of flu complications. Pneumonia, bronchitis, sinus infections, and ear infections are examples of flu-related complications. If you have a medical condition, such as heart disease, cancer, or diabetes, flu can make it worse. Flu can cause fever and chills, sore throat, muscle aches, fatigue, cough, headache, and runny or stuffy nose. Some people may have vomiting and diarrhea, though this is more common in children than adults. In an average year, thousands of people in the [place] die from flu, and many more are hospitalized. Flu vaccine prevents millions of illnesses and flu-related visits to the doctor each year. 2. Influenza vaccines CDC recommends everyone 6 months and older get vaccinated every flu season. Children 6 months through 8 years of age may need 2 doses during a single flu season. Everyone else needs only 1 dose each flu season. It takes about 2 weeks for protection to develop after vaccination. There are many flu viruses, and they are always changing. Each year a new flu vaccine is made to protect against the influenza viruses believed to be likely to cause disease in the upcoming flu season. Even when the vaccine doesn't exactly match these viruses, it may still provide some protection. Influenza vaccine does not cause flu. Influenza vaccine may be given at the same time as other vaccines. 3. Talk with your health care provider Tell your vaccination provider if the person getting the vaccine: Has had  an allergic reaction after a previous dose of influenza vaccine, or has any severe, life-threatening allergies Has ever had Guillain-Barr Syndrome (also called "GBS") In some cases, your health care provider may decide to postpone influenza vaccination until a future visit. Influenza vaccine can be administered at any time during pregnancy. People who are or will be pregnant during influenza season should receive inactivated influenza vaccine. People with minor illnesses, such as a cold, may be vaccinated. People who are moderately or severely ill should usually wait until they recover before getting influenza vaccine. Your health care provider can give you more information. 4. Risks of a vaccine reaction Soreness, redness, and swelling where the shot is given, fever, muscle aches, and headache can happen after influenza vaccination. There may be a very small increased risk of Guillain-Barr Syndrome (GBS) after inactivated influenza vaccine (the flu shot). Young children who get the flu shot along with pneumococcal vaccine (PCV13) and/or DTaP vaccine at the same time might be slightly more likely to have a seizure caused by fever. Tell your health care provider if a child who is getting flu vaccine has ever had a seizure. People sometimes faint after medical procedures, including vaccination. Tell your provider if you feel dizzy or have vision changes or ringing in the ears. As with any medicine, there is a very remote chance of a vaccine causing a severe allergic reaction, other serious injury, or death. 5. What if there is a serious problem? An allergic reaction could occur after the vaccinated person leaves the clinic. If you see signs of a severe allergic reaction (hives, swelling of the face and throat, difficulty breathing,   a fast heartbeat, dizziness, or weakness), call 9-1-1 and get the person to the nearest hospital. For other signs that concern you, call your health care provider. Adverse  reactions should be reported to the Vaccine Adverse Event Reporting System (VAERS). Your health care provider will usually file this report, or you can do it yourself. Visit the VAERS website at www.vaers.hhs.gov or call 1-800-822-7967. VAERS is only for reporting reactions, and VAERS staff members do not give medical advice. 6. The National Vaccine Injury Compensation Program The National Vaccine Injury Compensation Program (VICP) is a federal program that was created to compensate people who may have been injured by certain vaccines. Claims regarding alleged injury or death due to vaccination have a time limit for filing, which may be as short as two years. Visit the VICP website at www.hrsa.gov/vaccinecompensation or call 1-800-338-2382 to learn about the program and about filing a claim. 7. How can I learn more? Ask your health care provider. Call your local or state health department. Visit the website of the Food and Drug Administration (FDA) for vaccine package inserts and additional information at www.fda.gov/vaccines-blood-biologics/vaccines. Contact the Centers for Disease Control and Prevention (CDC): Call 1-800-232-4636 (1-800-CDC-INFO) or Visit CDC's website at www.cdc.gov/flu. Vaccine Information Statement Inactivated Influenza Vaccine (08/03/2020) This information is not intended to replace advice given to you by your health care provider. Make sure you discuss any questions you have with your health care provider. Document Revised: 09/05/2021 Document Reviewed: 09/05/2021 Elsevier Patient Education  2022 Elsevier Inc.  

## 2021-12-04 ENCOUNTER — Encounter: Payer: Self-pay | Admitting: Medical-Surgical

## 2021-12-10 ENCOUNTER — Ambulatory Visit: Payer: Commercial Managed Care - PPO | Admitting: Sports Medicine

## 2021-12-16 ENCOUNTER — Telehealth (INDEPENDENT_AMBULATORY_CARE_PROVIDER_SITE_OTHER): Payer: Commercial Managed Care - PPO | Admitting: Sports Medicine

## 2021-12-16 DIAGNOSIS — M5136 Other intervertebral disc degeneration, lumbar region: Secondary | ICD-10-CM

## 2021-12-16 DIAGNOSIS — M5412 Radiculopathy, cervical region: Secondary | ICD-10-CM

## 2021-12-16 DIAGNOSIS — M51369 Other intervertebral disc degeneration, lumbar region without mention of lumbar back pain or lower extremity pain: Secondary | ICD-10-CM

## 2021-12-16 MED ORDER — PREGABALIN 200 MG PO CAPS: 200.0000 mg | ORAL_CAPSULE | Freq: Two times a day (BID) | ORAL | 3 refills | Status: DC

## 2021-12-16 NOTE — Progress Notes (Signed)
° °  Virtual Visit via WebEx/MyChart   I connected with  Wesley Duran  on 12/16/21 via WebEx/MyChart/Doximity Video and verified that I am speaking with the correct person using two identifiers.   I discussed the limitations, risks, security and privacy concerns of performing an evaluation and management service by WebEx/MyChart/Doximity Video, including the higher likelihood of inaccurate diagnosis and treatment, and the availability of in person appointments.  We also discussed the likely need of an additional face to face encounter for complete and high quality delivery of care.  I also discussed with the patient that there may be a patient responsible charge related to this service. The patient expressed understanding and wishes to proceed.  Provider location is in medical facility. Patient location is at their home, different from provider location. People involved in care of the patient during this telehealth encounter were myself, my nurse/medical assistant, and my front office/scheduling team member.  Review of Systems: No fevers, chills, night sweats, weight loss, chest pain, or shortness of breath.   Objective Findings:    General: Speaking full sentences, no audible heavy breathing.  Sounds alert and appropriately interactive.  Appears well.  Face symmetric.  Extraocular movements intact.  Pupils equal and round.  No nasal flaring or accessory muscle use visualized.  Independent interpretation of tests performed by another provider:   None.  Brief History, Exam, Impression, and Recommendations:    Lumbar degenerative disc disease Wesley Duran returns in a video encounter, he has a long history of axial discogenic back pain with occasional radiation down the legs, had a temporary improvement with prednisone, home conditioning, Celebrex, still has discomfort so we will proceed with MRI for epidural planning. Also increasing Lyrica to 200 mg twice daily. At this point he has failed greater than  6 weeks of physician directed conservative treatment including physical therapy.  Radiculitis of right cervical region Wesley Duran is also having neck pain, bilateral hand radicular symptoms, he has C5-C7 DDD on x-rays, we will bump his Lyrica up to 200 mg twice daily, also getting a cervical spine MRI for interventional planning. He has failed greater than 6 weeks of physician directed conservative treatment including home physical therapy without improvement on reevaluation.   I discussed the above assessment and treatment plan with the patient. The patient was provided an opportunity to ask questions and all were answered. The patient agreed with the plan and demonstrated an understanding of the instructions.   The patient was advised to call back or seek an in-person evaluation if the symptoms worsen or if the condition fails to improve as anticipated.   I provided 30 minutes of face to face and non-face-to-face time during this encounter date, time was needed to gather information, review chart, records, communicate/coordinate with staff remotely, as well as complete documentation.   ___________________________________________ Ihor Austin. Benjamin Stain, M.D., ABFM., CAQSM. Primary Care and Sports Medicine [facility]  Adjunct Instructor of Family Medicine  [employer]

## 2021-12-16 NOTE — Assessment & Plan Note (Signed)
Wesley Duran is also having neck pain, bilateral hand radicular symptoms, he has C5-C7 DDD on x-rays, we will bump his Lyrica up to 200 mg twice daily, also getting a cervical spine MRI for interventional planning. He has failed greater than 6 weeks of physician directed conservative treatment including home physical therapy without improvement on reevaluation.

## 2021-12-16 NOTE — Assessment & Plan Note (Signed)
Wesley Duran returns in a video encounter, he has a long history of axial discogenic back pain with occasional radiation down the legs, had a temporary improvement with prednisone, home conditioning, Celebrex, still has discomfort so we will proceed with MRI for epidural planning. Also increasing Lyrica to 200 mg twice daily. At this point he has failed greater than 6 weeks of physician directed conservative treatment including physical therapy.

## 2021-12-26 ENCOUNTER — Other Ambulatory Visit: Payer: Self-pay | Admitting: Medical-Surgical

## 2022-01-04 ENCOUNTER — Ambulatory Visit (INDEPENDENT_AMBULATORY_CARE_PROVIDER_SITE_OTHER): Payer: Commercial Managed Care - PPO

## 2022-01-04 ENCOUNTER — Other Ambulatory Visit: Payer: Self-pay

## 2022-01-04 DIAGNOSIS — M542 Cervicalgia: Secondary | ICD-10-CM

## 2022-01-04 DIAGNOSIS — G8929 Other chronic pain: Secondary | ICD-10-CM

## 2022-01-04 DIAGNOSIS — M5136 Other intervertebral disc degeneration, lumbar region: Secondary | ICD-10-CM

## 2022-01-04 DIAGNOSIS — M5412 Radiculopathy, cervical region: Secondary | ICD-10-CM | POA: Diagnosis not present

## 2022-01-31 ENCOUNTER — Other Ambulatory Visit: Payer: Self-pay

## 2022-01-31 DIAGNOSIS — F339 Major depressive disorder, recurrent, unspecified: Secondary | ICD-10-CM

## 2022-01-31 MED ORDER — BUPROPION HCL ER (XL) 300 MG PO TB24: ORAL_TABLET | ORAL | 0 refills | Status: DC

## 2022-02-26 ENCOUNTER — Telehealth: Payer: Commercial Managed Care - PPO | Admitting: Internal Medicine

## 2022-03-08 LAB — NMR, LIPOPROFILE
Cholesterol, Total: 236 mg/dL — ABNORMAL HIGH (ref 100–199)
HDL Particle Number: 30.7 umol/L (ref >=30.5)
HDL-C: 25 mg/dL — ABNORMAL LOW (ref >39)
LDL Particle Number: 1929 nmol/L — ABNORMAL HIGH (ref <1000)
LDL Size: 19.5 nm — ABNORMAL LOW (ref >20.5)
LDL-C (NIH Calc): 86 mg/dL (ref 0–99)
LP-IR Score: 92 — ABNORMAL HIGH (ref <=45)
Small LDL Particle Number: 1441 nmol/L — ABNORMAL HIGH (ref <=527)
Triglycerides: 760 mg/dL (ref 0–149)

## 2022-03-08 LAB — LDL CHOLESTEROL, DIRECT: LDL Direct: 101 mg/dL — ABNORMAL HIGH (ref 0–99)

## 2022-03-10 ENCOUNTER — Other Ambulatory Visit: Payer: Self-pay | Admitting: Medical-Surgical

## 2022-03-11 ENCOUNTER — Ambulatory Visit (HOSPITAL_BASED_OUTPATIENT_CLINIC_OR_DEPARTMENT_OTHER): Payer: Self-pay | Admitting: Internal Medicine

## 2022-03-19 ENCOUNTER — Encounter (HOSPITAL_BASED_OUTPATIENT_CLINIC_OR_DEPARTMENT_OTHER): Payer: Self-pay | Admitting: Internal Medicine

## 2022-03-31 ENCOUNTER — Other Ambulatory Visit: Payer: Self-pay | Admitting: Medical-Surgical

## 2022-04-03 ENCOUNTER — Other Ambulatory Visit: Payer: Self-pay

## 2022-04-03 MED ORDER — CELECOXIB 200 MG PO CAPS: ORAL_CAPSULE | ORAL | 0 refills | Status: DC

## 2022-04-04 ENCOUNTER — Encounter: Payer: Self-pay | Admitting: Medical-Surgical

## 2022-04-04 DIAGNOSIS — F339 Major depressive disorder, recurrent, unspecified: Secondary | ICD-10-CM

## 2022-04-04 DIAGNOSIS — I10 Essential (primary) hypertension: Secondary | ICD-10-CM

## 2022-04-07 ENCOUNTER — Telehealth: Payer: Self-pay | Admitting: Internal Medicine

## 2022-04-07 MED ORDER — SILDENAFIL CITRATE 20 MG PO TABS: 20.0000 mg | ORAL_TABLET | ORAL | 11 refills | Status: AC | PRN

## 2022-04-07 MED ORDER — LOSARTAN POTASSIUM-HCTZ 100-25 MG PO TABS: 1.0000 | ORAL_TABLET | Freq: Every day | ORAL | 1 refills | Status: DC

## 2022-04-07 MED ORDER — EZETIMIBE 10 MG PO TABS: 10.0000 mg | ORAL_TABLET | Freq: Every day | ORAL | 1 refills | Status: DC

## 2022-04-07 MED ORDER — BUPROPION HCL ER (XL) 300 MG PO TB24: ORAL_TABLET | ORAL | 1 refills | Status: DC

## 2022-04-07 MED ORDER — TRAZODONE HCL 50 MG PO TABS: 25.0000 mg | ORAL_TABLET | Freq: Every day | ORAL | 1 refills | Status: DC

## 2022-04-07 MED ORDER — METOPROLOL SUCCINATE ER 100 MG PO TB24: 100.0000 mg | ORAL_TABLET | Freq: Every day | ORAL | 1 refills | Status: DC

## 2022-04-07 NOTE — Telephone Encounter (Signed)
Repatha assistance application faxed to [employer] @ [phone] ?

## 2022-04-14 NOTE — Telephone Encounter (Signed)
Patient approved for Repatha assistance from [employer] until 04/11/2023 ?

## 2022-04-15 ENCOUNTER — Encounter: Payer: Self-pay | Admitting: Family Medicine

## 2022-04-15 ENCOUNTER — Ambulatory Visit (INDEPENDENT_AMBULATORY_CARE_PROVIDER_SITE_OTHER): Payer: Self-pay | Admitting: Family Medicine

## 2022-04-15 VITALS — BP 144/97 | HR 72 | Resp 18 | Ht 67.0 in | Wt 250.0 lb

## 2022-04-15 DIAGNOSIS — R21 Rash and other nonspecific skin eruption: Secondary | ICD-10-CM

## 2022-04-15 DIAGNOSIS — I10 Essential (primary) hypertension: Secondary | ICD-10-CM

## 2022-04-15 DIAGNOSIS — H1032 Unspecified acute conjunctivitis, left eye: Secondary | ICD-10-CM

## 2022-04-15 MED ORDER — TRIAMCINOLONE ACETONIDE 0.1 % EX CREA: 1.0000 "application " | TOPICAL_CREAM | Freq: Two times a day (BID) | CUTANEOUS | 0 refills | Status: AC

## 2022-04-15 MED ORDER — AMLODIPINE BESYLATE 10 MG PO TABS: 10.0000 mg | ORAL_TABLET | Freq: Every day | ORAL | 1 refills | Status: DC

## 2022-04-15 MED ORDER — ERYTHROMYCIN 5 MG/GM OP OINT: 1.0000 "application " | TOPICAL_OINTMENT | Freq: Three times a day (TID) | OPHTHALMIC | 0 refills | Status: DC

## 2022-04-15 NOTE — Progress Notes (Signed)
? ?Acute Office Visit ? ?Subjective:  ? ?  ?Patient ID: Wesley Duran, male    DOB: 18-Jan-1971, 51 y.o.   MRN: [id number] ? ?Chief Complaint  ?Patient presents with  ? Eye Pain  ?  Left eye pain, swelling and drainage for 2 days.  ? Rash  ?  And swelling on right leg, 2-3 months   ? ? ?HPI ?Patient is in today for  ?  ?Left eye pain, swelling and drainage for 2 days.  He says prior to that it was getting a little irritated and little bit swollen but then by the end of the day would seem to Clear up on his own so he did not think much about it but for the last 2 days the swelling and discomfort has been persistent he has been getting a yellow crust.  He feels like his vision is a little blurry on that side it feels uncomfortable but not painful.  No recent upper respiratory symptoms.  He does have cyst seasonal allergies and has been taking his Zyrtec but does not normally get significant eye symptoms like this.  Usually just a little watery eye. ? ?He also reports a rash on his lower legs that comes and goes its been there for months.  He does not tend to moisturize no family history of eczema or psoriasis.  No known allergies except for seasonal. ? ? ?ROS ? ? ?   ?Objective:  ?  ?BP (!) 144/97 (BP Location: Right Arm)   Pulse 72   Resp 18   Ht 5\' 7"  (1.702 m)   Wt 250 lb (113.4 kg)   SpO2 95%   BMI 39.16 kg/m?  ? ? ?Physical Exam ?Vitals reviewed.  ?Constitutional:   ?   Appearance: He is well-developed.  ?HENT:  ?   Head: Normocephalic and atraumatic.  ?Eyes:  ?   Extraocular Movements: Extraocular movements intact.  ?   Comments: Adjunctive is injected on the left and the upper lid is mildly swollen.  No active drainage.  No stye or palpable lesion.  Right eye is normal in appearance.  ?Cardiovascular:  ?   Rate and Rhythm: Normal rate.  ?Pulmonary:  ?   Effort: Pulmonary effort is normal.  ?Skin: ?   General: Skin is dry.  ?   Coloration: Skin is not pale.  ?   Comments: On his left lower leg he has 2  approximately 1 to 1/2 cm circular shaped erythematous lesions with a thicker scale.  He has a similar patch on his right shin.  And significant dry skin diffusely.  ?Neurological:  ?   Mental Status: He is alert and oriented to person, place, and time.  ?Psychiatric:     ?   Behavior: Behavior normal.  ? ? ?No results found for any visits on 04/15/22. ? ? ?   ?Assessment & Plan:  ? ?Problem List Items Addressed This Visit   ?None ?Visit Diagnoses   ? ? Rash and nonspecific skin eruption    -  Primary  ? Relevant Medications  ? triamcinolone cream (KENALOG) 0.1 %  ? Acute conjunctivitis of left eye, unspecified acute conjunctivitis type      ? Relevant Medications  ? erythromycin ophthalmic ointment  ? Hypertension, unspecified type      ? Relevant Medications  ? Evolocumab (REPATHA SURECLICK) 140 MG/ML SOAJ  ? amLODipine (NORVASC) 10 MG tablet  ? ?  ? ?Left conjunctivitis-we will treat with erythromycin ophthalmic ointment and  cool compresses.  No evidence of stye etc.  If not improving over the next 48 hours then please let us know.  He does wear glasses but does not have a recent eye doctor that he sees. ? ?Rash on lower legs-rash is most consistent with eczema we discussed moisturizing, moisturizing, and moisturizing.  And also sent over prescription for triamcinolone cream to use as needed just on the affected areas. ? ?Repeat blood pressure was still elevated today.  It was also elevated back in November.  He is on max dose losartan HCTZ as well as metoprolol.  He does have some room to go up on the amlodipine.  We will increase to 10 mg and have him follow-up with his PCP in a couple weeks.  Consider switching metoprolol to Bystolic for the more powerful blood pressure lowering if affordable with his current insurance plan.  It looks like he is self-pay. ? ?Meds ordered this encounter  ?Medications  ? erythromycin ophthalmic ointment  ?  Sig: Place 1 application. into the left eye 3 (three) times daily. X 7  days  ?  Dispense:  3.5 g  ?  Refill:  0  ? triamcinolone cream (KENALOG) 0.1 %  ?  Sig: Apply 1 application. topically 2 (two) times daily. To affected areas on legs  ?  Dispense:  45 g  ?  Refill:  0  ? amLODipine (NORVASC) 10 MG tablet  ?  Sig: Take 1 tablet (10 mg total) by mouth daily.  ?  Dispense:  90 tablet  ?  Refill:  1  ? ? ?No follow-ups on file. ? ?Nani Gasser, MD ? ? ?

## 2022-04-21 ENCOUNTER — Encounter: Payer: Self-pay | Admitting: Family Medicine

## 2022-04-21 MED ORDER — CEPHALEXIN 500 MG PO CAPS: 500.0000 mg | ORAL_CAPSULE | Freq: Three times a day (TID) | ORAL | 0 refills | Status: DC

## 2022-05-11 ENCOUNTER — Encounter: Payer: Self-pay | Admitting: Sports Medicine

## 2022-05-11 DIAGNOSIS — M5412 Radiculopathy, cervical region: Secondary | ICD-10-CM

## 2022-05-12 MED ORDER — PREGABALIN 200 MG PO CAPS: 200.0000 mg | ORAL_CAPSULE | Freq: Two times a day (BID) | ORAL | 3 refills | Status: DC

## 2022-05-23 ENCOUNTER — Encounter: Payer: Self-pay | Admitting: Medical-Surgical

## 2022-05-23 ENCOUNTER — Ambulatory Visit (INDEPENDENT_AMBULATORY_CARE_PROVIDER_SITE_OTHER): Payer: Self-pay | Admitting: Medical-Surgical

## 2022-05-23 VITALS — BP 149/97 | HR 82 | Resp 20 | Ht 67.0 in | Wt 252.7 lb

## 2022-05-23 DIAGNOSIS — Z6836 Body mass index (BMI) 36.0-36.9, adult: Secondary | ICD-10-CM

## 2022-05-23 DIAGNOSIS — Z716 Tobacco abuse counseling: Secondary | ICD-10-CM | POA: Insufficient documentation

## 2022-05-23 DIAGNOSIS — E7849 Other hyperlipidemia: Secondary | ICD-10-CM

## 2022-05-23 DIAGNOSIS — I10 Essential (primary) hypertension: Secondary | ICD-10-CM

## 2022-05-23 DIAGNOSIS — G479 Sleep disorder, unspecified: Secondary | ICD-10-CM

## 2022-05-23 DIAGNOSIS — Z1211 Encounter for screening for malignant neoplasm of colon: Secondary | ICD-10-CM

## 2022-05-23 DIAGNOSIS — R7303 Prediabetes: Secondary | ICD-10-CM | POA: Insufficient documentation

## 2022-05-23 DIAGNOSIS — E6609 Other obesity due to excess calories: Secondary | ICD-10-CM | POA: Insufficient documentation

## 2022-05-23 DIAGNOSIS — F339 Major depressive disorder, recurrent, unspecified: Secondary | ICD-10-CM

## 2022-05-23 MED ORDER — TRAZODONE HCL 100 MG PO TABS: 100.0000 mg | ORAL_TABLET | Freq: Every day | ORAL | 1 refills | Status: DC

## 2022-05-23 MED ORDER — CELECOXIB 200 MG PO CAPS: 200.0000 mg | ORAL_CAPSULE | Freq: Two times a day (BID) | ORAL | 1 refills | Status: DC

## 2022-05-23 MED ORDER — AMLODIPINE BESYLATE 10 MG PO TABS: 10.0000 mg | ORAL_TABLET | Freq: Every day | ORAL | 1 refills | Status: DC

## 2022-05-23 MED ORDER — VALSARTAN-HYDROCHLOROTHIAZIDE 160-25 MG PO TABS: 1.0000 | ORAL_TABLET | Freq: Every day | ORAL | 3 refills | Status: DC

## 2022-05-23 NOTE — Progress Notes (Signed)
Established Patient Office Visit  Subjective   Patient ID: Wesley Duran, male   DOB: 1971-09-25 Age: 51 y.o. MRN: [id number]   Chief Complaint  Patient presents with   Follow-up    Discuss all Medications    HPI Pleasant 51 year old male presenting today for the following:  Hypertension: taking Amlodipine 10mg , metoprolol 100mg , and Losartan-HCTZ 100-25mg  daily. Tolerating well but having some lower extremity edema. Checking home BPs with variable readings. Hypotension with viagra at 80mg  not at 60mg .  Unfortunately, 60 mg is not as effective.  Interested in other options that may not cause the hypotension.  When he does take Viagra, he admits to not taking his blood pressure medications that day to prevent the hypotension issue.  Hyperlipidemia: He is being followed by the advanced lipid clinic.  He is currently taking Zetia and Repatha as prescribed.    Weight: Taking Wellbutrin 300 mg daily.  Notes that when he started taking the medication, he saw a 12 pound weight loss but unfortunately this has reversed and he has now gained a bit of weight.  He is working on dietary modification and trying to get regular intentional exercise.  Notes that he does not always eat healthy but he does about 80% of the time.  He does overnight 12-hour fasts.  He is interested in an injectable medication but he is currently uninsured.  He reports he will have insurance in the next couple of weeks.  Tobacco use: Has been making efforts to stop smoking.  He has cut back approximately 60% on his smoking habit and continues to work on it.   Insomnia: Taking trazodone 50 mg nightly, tolerating well without side effects.  Notes the medication is not very effective and he still has a hard time sleeping at night.  Review of Systems  Constitutional:  Negative for chills, fever and malaise/fatigue.  Respiratory:  Negative for cough, shortness of breath and wheezing.   Cardiovascular:  Positive for leg swelling.  Negative for chest pain and palpitations.  Neurological:  Negative for dizziness and headaches.  Psychiatric/Behavioral:  Negative for depression and suicidal ideas. The patient has insomnia. The patient is not nervous/anxious.      Objective:    Vitals:   05/23/22 1446 05/23/22 1552  BP: (!) 145/88 (!) 149/97  Pulse: 78 82  Resp: 20 20  Height: 5\' 7"  (1.702 m)   Weight: 252 lb 11.2 oz (114.6 kg)   SpO2: 95% 92%  BMI (Calculated): 39.57      Physical Exam Vitals reviewed.  Constitutional:      General: He is not in acute distress.    Appearance: Normal appearance. He is obese. He is not ill-appearing.  HENT:     Head: Normocephalic.  Cardiovascular:     Rate and Rhythm: Normal rate.     Pulses: Normal pulses.     Heart sounds: Normal heart sounds. No murmur heard.   No friction rub. No gallop.  Pulmonary:     Effort: Pulmonary effort is normal. No respiratory distress.     Breath sounds: Normal breath sounds.  Skin:    General: Skin is warm and dry.  Neurological:     Mental Status: He is alert and oriented to person, place, and time.  Psychiatric:        Mood and Affect: Mood normal.        Behavior: Behavior normal.        Thought Content: Thought content normal.  Judgment: Judgment normal.   No results found for this or any previous visit (from the past 24 hour(s)).     The ASCVD Risk score (Arnett DK, et al., 2019) failed to calculate for the following reasons:   The patient has a prior MI or stroke diagnosis   Assessment & Plan:   Essential hypertension Suspect lower extremity edema is multifactorial in part related to amlodipine 10 mg daily.  Discussed conservative measures to help manage lower extremity edema including low-sodium diet, compression socks, elevation of the lower extremities, and staying well-hydrated.  For now continue amlodipine at 10 mg daily.  Switching from losartan-HCTZ to valsartan-HCTZ 160-25 mg daily.  Continue metoprolol 100 mg  daily.  Monitor blood pressure at home with goal of 130/80 or less.  Plan to return in 2 weeks for nurse visit for blood pressure check.  Class 2 severe obesity due to excess calories with serious comorbidity and body mass index (BMI) of 36.0 to 36.9 in adult [facility]) Discussed caloric limitation for a daily calorie deficit.  Unfortunately Wellbutrin seem to work well at the beginning however it has lost its effectiveness.  In a couple of weeks, we will see how his insurance coverage looks and aim for an injectable medication to aid with weight loss.  Recommend continuing regular intentional exercise and increase intensity as tolerated.  Continue dietary modifications.  Depression, recurrent (HCC) Continue Wellbutrin 300 mg daily.  Familial hyperlipidemia Managed by the advanced lipid clinic.  Trouble in sleeping Increase trazodone to 100 mg nightly.  Colon cancer screening Cologuard ordered.  Return in about 2 weeks (around 06/06/2022) for nurse visit for BP check.  ___________________________________________ Thayer Ohm, DNP, APRN, FNP-BC Primary Care and Sports Medicine [facility] [place]

## 2022-05-23 NOTE — Assessment & Plan Note (Signed)
Suspect lower extremity edema is multifactorial in part related to amlodipine 10 mg daily.  Discussed conservative measures to help manage lower extremity edema including low-sodium diet, compression socks, elevation of the lower extremities, and staying well-hydrated.  For now continue amlodipine at 10 mg daily.  Switching from losartan-HCTZ to valsartan-HCTZ 160-25 mg daily.  Continue metoprolol 100 mg daily.  Monitor blood pressure at home with goal of 130/80 or less.  Plan to return in 2 weeks for nurse visit for blood pressure check.

## 2022-05-23 NOTE — Assessment & Plan Note (Signed)
Discussed caloric limitation for a daily calorie deficit.  Unfortunately Wellbutrin seem to work well at the beginning however it has lost its effectiveness.  In a couple of weeks, we will see how his insurance coverage looks and aim for an injectable medication to aid with weight loss.  Recommend continuing regular intentional exercise and increase intensity as tolerated.  Continue dietary modifications.

## 2022-05-23 NOTE — Assessment & Plan Note (Signed)
Increase trazodone to 100mg nightly.  

## 2022-05-23 NOTE — Assessment & Plan Note (Signed)
Continue Wellbutrin 300 mg daily. 

## 2022-05-23 NOTE — Assessment & Plan Note (Signed)
Managed by the advanced lipid clinic.

## 2022-05-23 NOTE — Assessment & Plan Note (Signed)
Cologuard ordered

## 2022-05-26 IMAGING — MR MR LUMBAR SPINE W/O CM
4 of 5 series · 26 of 48 positions shown · non-contrast
Comparison: Plain films lumbar spine 10/29/2021.

CLINICAL DATA: Low back and bilateral upper and lower extremity
pain. No known injury.

EXAM:
MRI LUMBAR SPINE WITHOUT CONTRAST
TECHNIQUE: Multiplanar, multisequence MR imaging of the lumbar spine was
performed. No intravenous contrast was administered.

[Series 2: T2 · sagittal · 4.0mm · 0.81mm/px · 6 of 15 slices shown (1 of 2)]
[im 1/15]
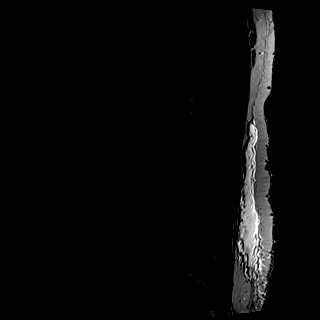
[im 3/15]
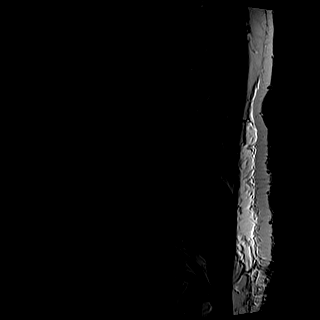
[im 6/15]
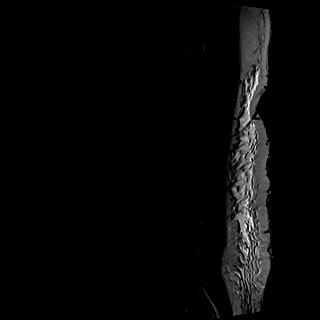
[im 9/15]
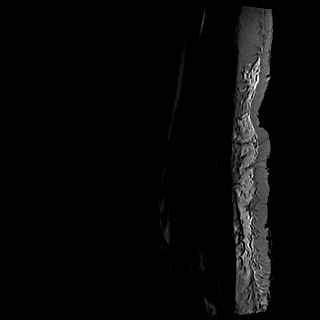
[im 12/15]
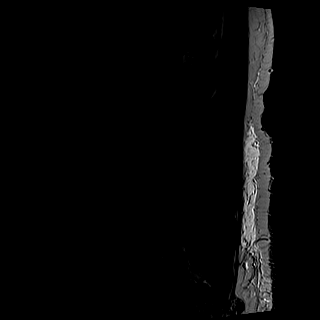
[im 15/15]
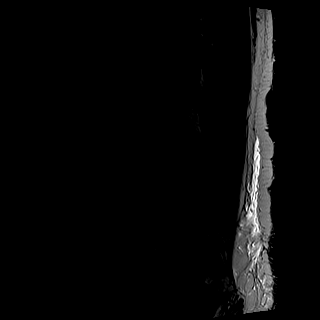

[Series 3: T1 · sagittal · 4.0mm · 0.41mm/px · 6 of 15 slices shown (1 of 2)]
[im 1/15]
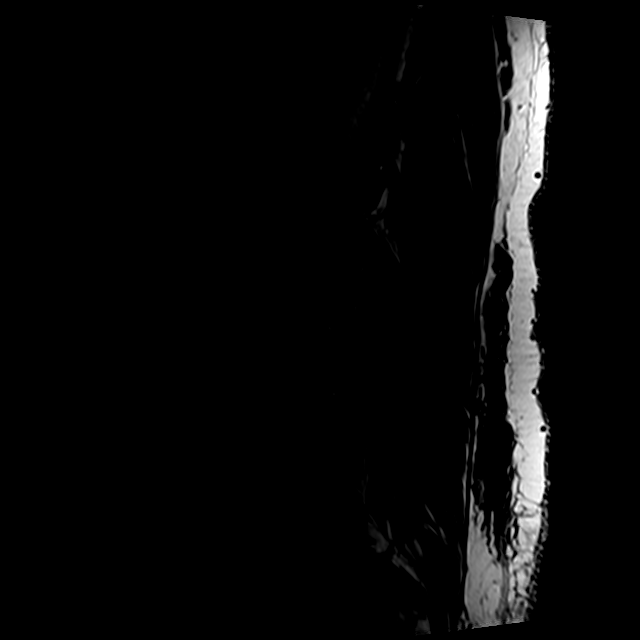
[im 3/15]
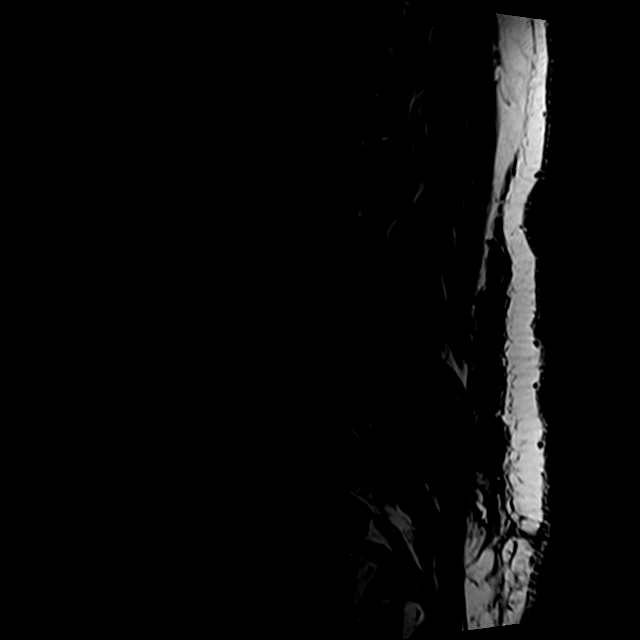
[im 6/15]
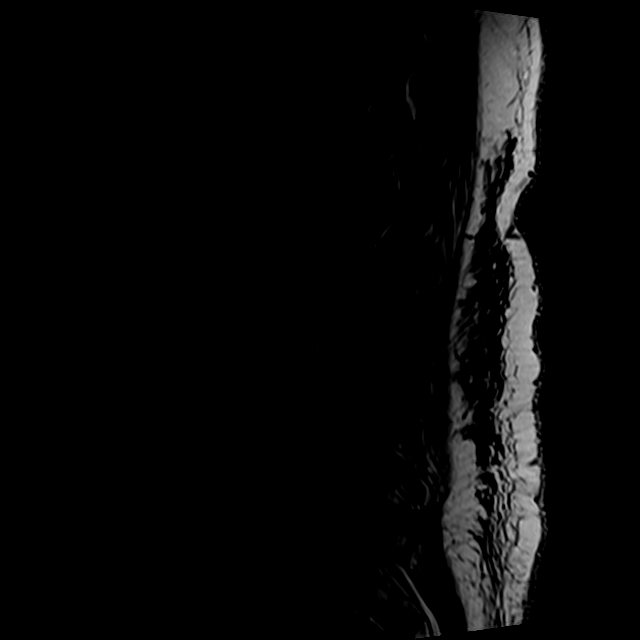
[im 9/15]
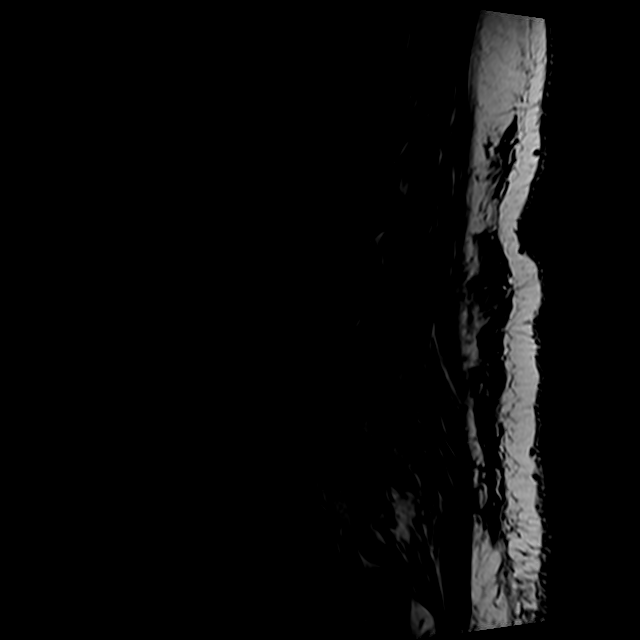
[im 12/15]
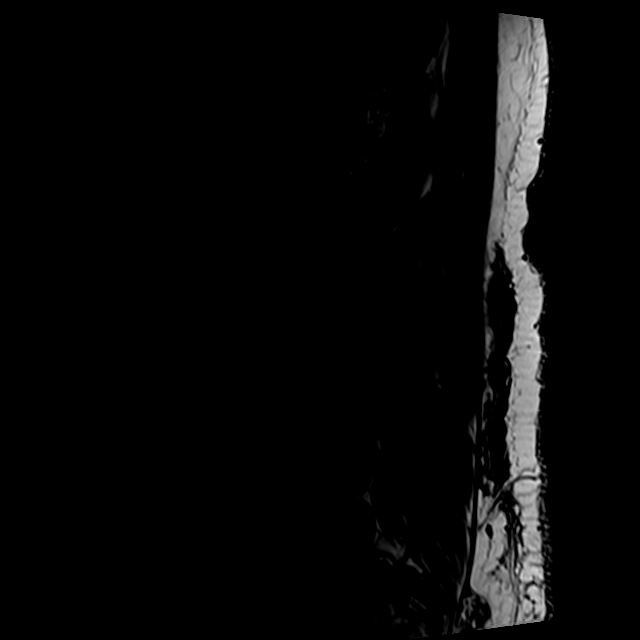
[im 15/15]
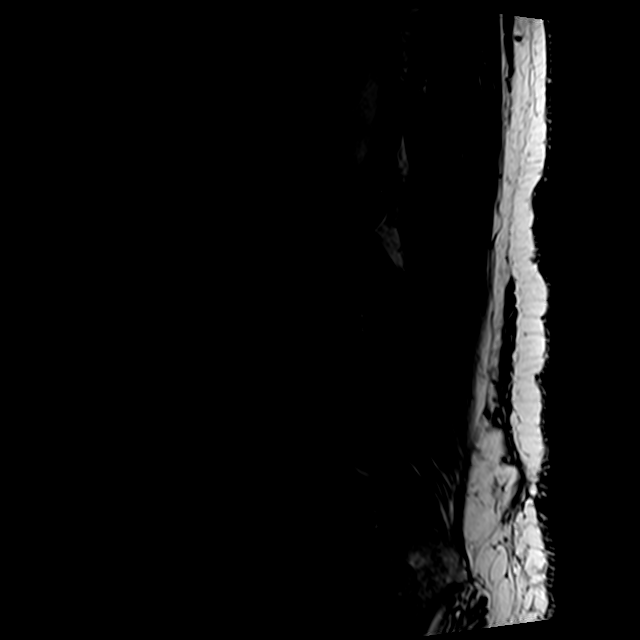

[Series 5: T2 · axial · 4.0mm · 0.78mm/px · z∈[-109,+100]mm · 9 of 37 slices shown (2 of 2)]
[im 1/37]
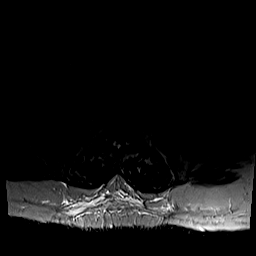
[im 6/37]
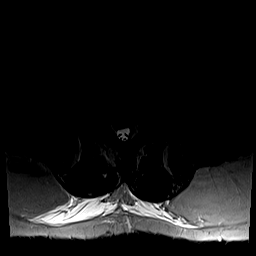
[im 11/37]
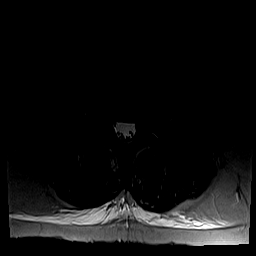
[im 16/37]
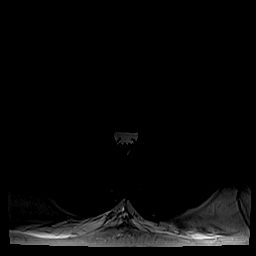
[im 19/37]
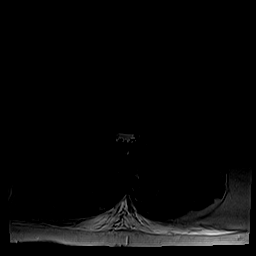
[im 21/37]
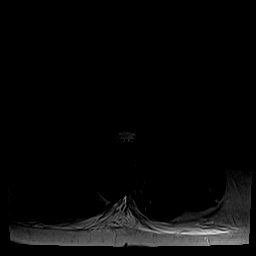
[im 26/37]
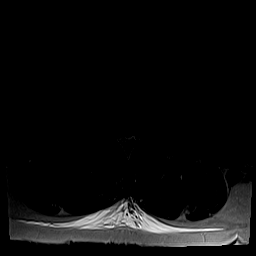
[im 31/37]
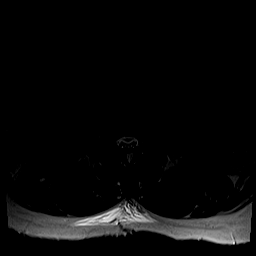
[im 37/37]
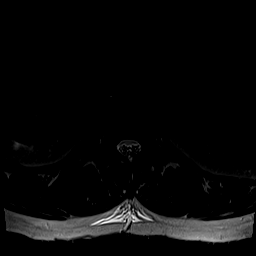

[Series 6: T1 · axial · 4.0mm · 0.39mm/px · z∈[-109,+70]mm · 5 of 37 slices shown (2 of 2)]
[im 1/37]
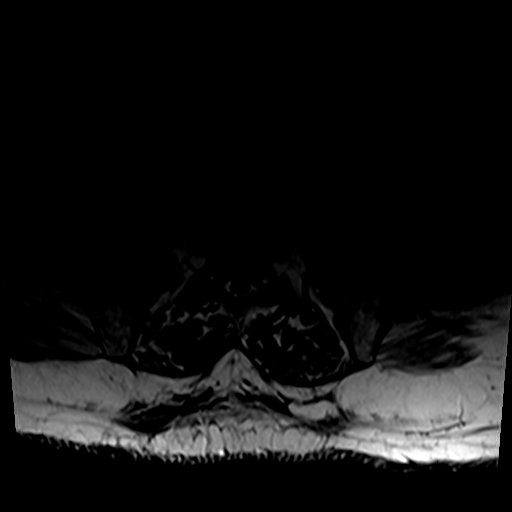
[im 6/37]
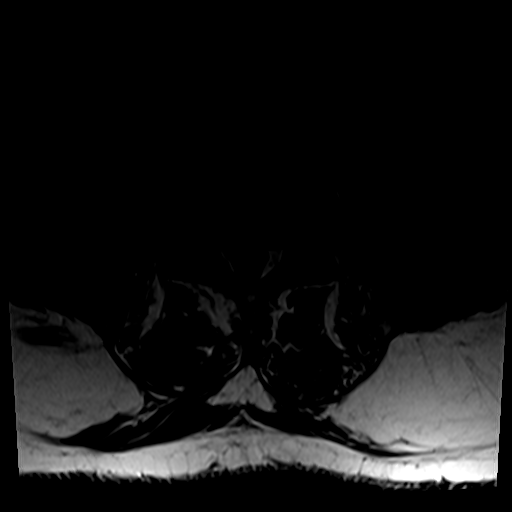
[im 11/37]
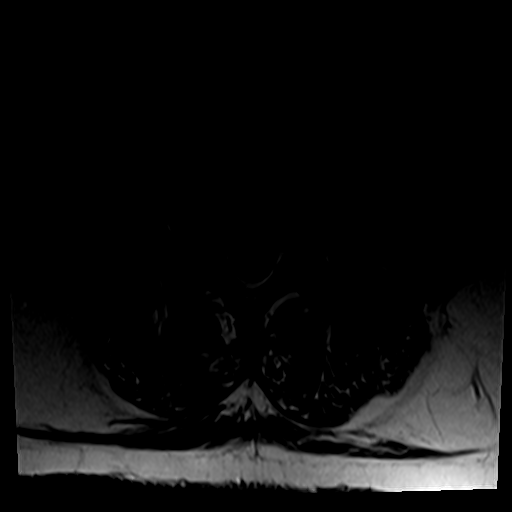
[im 19/37]
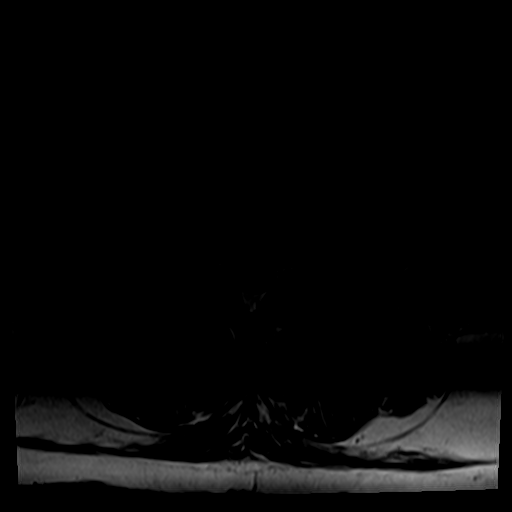
[im 31/37]
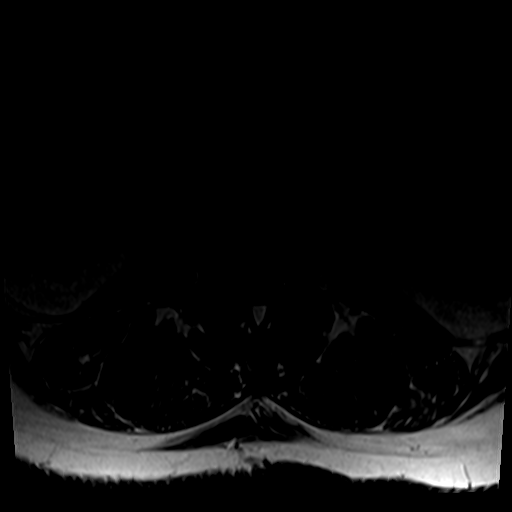

[26 of 48 positions shown; findings below may reference images not displayed]

FINDINGS: Segmentation:  Standard.

Alignment:  Normal.

Vertebrae: No fracture, evidence of discitis, or worrisome bone
lesion. A few small hemangiomas are incidentally noted.

Conus medullaris and cauda equina: Conus extends to the T12 level.
Conus and cauda equina appear normal.

Paraspinal and other soft tissues: Negative.

Disc levels:

T11-12 and T12-L1 are imaged in the sagittal plane only. There is a
minimal disc bulge at T10-11 but no stenosis at either level.

L1-2: Negative.

L2-3: Mild facet degenerative disease.  Otherwise negative.

L3-4: Shallow disc bulge without stenosis.

L4-5: A shallow disc bulge and superimposed protrusion in the
inferior aspect of the left foramen. The protrusion causes moderate
foraminal stenosis. The central canal and right foramen are open.

L5-S1: The patient has a right paracentral disc protrusion indenting
the ventral thecal sac. The disc contacts both S1 roots and deflects
the right S1 root. Moderate bilateral foraminal narrowing is
present.
IMPRESSION: Disc protrusion in the left foramen at L4-5 causes moderate
foraminal stenosis.

Right paracentral protrusion at L5-S1 contacts both S1 roots and
deflects the right S1 root. There is moderate bilateral foraminal
narrowing at L5-S1.

## 2022-06-06 ENCOUNTER — Ambulatory Visit: Payer: Self-pay

## 2022-06-23 ENCOUNTER — Telehealth: Payer: Self-pay | Admitting: Internal Medicine

## 2022-06-23 ENCOUNTER — Other Ambulatory Visit: Payer: Self-pay

## 2022-06-23 MED ORDER — FUROSEMIDE 20 MG PO TABS: 20.0000 mg | ORAL_TABLET | Freq: Every day | ORAL | 0 refills | Status: DC

## 2022-06-23 NOTE — Telephone Encounter (Signed)
Patient informed of taking lasix 20 mg for three days. He will start tomorrow and keep [place] informed of the edema. He will keep his scheduled appointment.

## 2022-07-01 NOTE — Progress Notes (Signed)
Office Visit    Patient Name: Wesley Duran Date of Encounter: 07/02/2022  PCP:  Christen Butter, NP   [facility]  Cardiologist:  Olga Millers, MD  Advanced Practice Provider:  No care team member to display Electrophysiologist:  None      Chief Complaint    Wesley Duran is a 51 y.o. male presents today for LE edema   Past Medical History    Past Medical History:  Diagnosis Date   Arthritis    CAD (coronary artery disease)    Heart attack (HCC)    Hyperlipidemia    Hypertension    Sleep apnea    Past Surgical History:  Procedure Laterality Date   NO PAST SURGERIES      Allergies  Allergies  Allergen Reactions   Statins Other (See Comments)    Myalgias    History of Present Illness    Wesley Duran is a 51 y.o. male with a hx of CAD s/p NSTEMI and balloon angioplasty 2017, HLD, HTN, obesity, tobacco use last seen via video visit 11/01/21 for lipid consult with Dr. Rennis Golden.  Prior NSTEMI March 2017 due to occlusion of remus branch with successful angioplasty. Echo with normal LVEF, moderate LVH, trace MR. At virtual visit 11/01/21 with Dr. Rennis Golden he was started [id number].   Contacted the office 06/23/22 noting LE edema. Provided Lasix 20mg  QD x 3 days.   He presents today for follow up independently. Tells me swelling has been a problem on and off. It overall has not been bothersome but has "seriously progressed" to be more bothersome. Yesterday could not get his shoes on due to swelling. Also notes his left eye has some swelling more consistent with blepharitis, he has an ointment from PCP encouraged to continue to use. Notes snoring and wife witnesses him stop breathing. He has not had a sleep study due to lack of insurance. No orthopnea, PND, chest pain. Presently waiting on disability case to go through due to spine issues and nerve pain.   EKGs/Labs/Other Studies Reviewed:   The following studies were reviewed today:  EKG:  EKG is ordered today.  The ekg  ordered today demonstrates NSR 79 bpm no acute ST/T wave changes with prolonged QTc .   Recent Labs: 10/02/2021: ALT 70; BUN 15; Creat 0.80; Hemoglobin 15.1; Platelets 312; Potassium 5.0; Sodium 136; TSH 1.64  Recent Lipid Panel    Component Value Date/Time   CHOL 279 (H) 10/02/2021 0853   TRIG 668 (H) 10/02/2021 0853   HDL 38 (L) 10/02/2021 0853   CHOLHDL 7.3 (H) 10/02/2021 0853   LDLCALC  10/02/2021 0853     Comment:     . LDL cholesterol not calculated. Triglyceride levels greater than 400 mg/dL invalidate calculated LDL results. . Reference range: <100 . Desirable range <100 mg/dL for primary prevention;   <70 mg/dL for patients with CHD or diabetic patients  with > or = 2 CHD risk factors. Marland Kitchen LDL-C is now calculated using the Martin-Hopkins  calculation, which is a validated novel method providing  better accuracy than the Friedewald equation in the  estimation of LDL-C.  Wesley Duran et al. Lenox Ahr. 8413;244(01): 2061-2068  (http://education.[employer].com/faq/FAQ164)    LDLDIRECT 101 (H) 03/06/2022 0804    Home Medications   Current Meds  Medication Sig   amLODipine (NORVASC) 10 MG tablet Take 1 tablet (10 mg total) by mouth daily.   buPROPion (WELLBUTRIN XL) 300 MG 24 hr tablet TAKE ONE (300MG ) TABLET BY MOUTH  DAILY.   celecoxib (CELEBREX) 200 MG capsule Take 1 capsule (200 mg total) by mouth 2 (two) times daily.   cetirizine (ZYRTEC) 10 MG tablet Take 10 mg by mouth daily.   Evolocumab (REPATHA SURECLICK) 140 MG/ML SOAJ Inject into the skin.   ezetimibe (ZETIA) 10 MG tablet Take 1 tablet (10 mg total) by mouth daily.   metoprolol succinate (TOPROL-XL) 100 MG 24 hr tablet Take 1 tablet (100 mg total) by mouth daily. Take with or immediately following a meal.   pregabalin (LYRICA) 200 MG capsule Take 1 capsule (200 mg total) by mouth 2 (two) times daily.   sildenafil (REVATIO) 20 MG tablet Take 1-5 tablets (20-100 mg total) by mouth as needed. MDD: 100 mg    traZODone (DESYREL) 100 MG tablet Take 1 tablet (100 mg total) by mouth at bedtime.   triamcinolone cream (KENALOG) 0.1 % Apply 1 application. topically 2 (two) times daily. To affected areas on legs   valsartan-hydrochlorothiazide (DIOVAN-HCT) 160-25 MG tablet Take 1 tablet by mouth daily.     Review of Systems      All other systems reviewed and are otherwise negative except as noted above.  Physical Exam    VS:  BP (!) 144/100 (BP Location: Left Arm, Patient Position: Sitting, Cuff Size: Large)   Pulse 79   Ht 5\' 7"  (1.702 m)   Wt 260 lb 11.2 oz (118.3 kg)   BMI 40.83 kg/m  , BMI Body mass index is 40.83 kg/m.  Wt Readings from Last 3 Encounters:  07/02/22 260 lb 11.2 oz (118.3 kg)  05/23/22 252 lb 11.2 oz (114.6 kg)  04/15/22 250 lb (113.4 kg)     GEN: Well nourished, overweight, well developed, in no acute distress. HEENT: normal. Neck: Supple, no JVD, carotid bruits, or masses. Cardiac: RRR, no murmurs, rubs, or gallops. No clubbing, cyanosis, edema.  Radials/PT 2+ and equal bilaterally.  Respiratory:  Respirations regular and unlabored, clear to auscultation bilaterally. GI: Soft, nontender, nondistended. MS: No deformity or atrophy. Skin: Warm and dry, no rash. Neuro:  Strength and sensation are intact. Psych: Normal affect.  Assessment & Plan    LE edema -likely etiology venous insufficiency has improved with compression socks and elevation.  Discussed the importance of low-sodium diet, elevation.  Low suspicion amlodipine contributory as it is not constant.  Will prescribe Lasix 20 mg as needed-he will inform us of taking more than twice per week.  If so plan to discontinue hydrochlorothiazide and transition to daily Lasix.  BMP, BNP, magnesium today.  Update echo to rule out heart failure.  Prolonged QT -mildly prolonged QTc on EKG today.  Update BMP, magnesium.  CAD - Stable with no anginal symptoms. No indication for ischemic evaluation.  GDMT includes Zetia,  Repatha, Toprol. Heart healthy diet and regular cardiovascular exercise encouraged.    HTN - BP not at goal <130/80.  On 05/23/2022 PCP transition him from losartan-HCTZ to valsartan-HCTZ.  He has not yet made this change.  Encouraged to do so his BP not at goal.  If BP remains greater than 130/80 could consider increased dose of valsartan. Discussed to monitor BP at home at least 2 hours after medications and sitting for 5-10 minutes.   HLD, LDL goal <70 -Intolerant to statin. On Repatha today is his 6th dose of Repatha.  Upcoming follow-up with Dr. Rennis Golden  Tobacco use  -has been working to cut back.  Smoking cessation encouraged. Recommend utilization of 1800QUITNOW.   Snores / Sleep disordered  breathing -notes snoring and wife witnessed apneic episodes.  Awaiting insurance card in the mail.  He prefers to consider sleep study at follow-up.   Disposition: Follow up in 3 month(s) with Olga Millers, MD or APP.  Signed, Alver Sorrow, NP 07/02/2022, 9:14 AM [facility]

## 2022-07-02 ENCOUNTER — Ambulatory Visit (INDEPENDENT_AMBULATORY_CARE_PROVIDER_SITE_OTHER): Payer: Self-pay | Admitting: Family

## 2022-07-02 ENCOUNTER — Encounter (HOSPITAL_BASED_OUTPATIENT_CLINIC_OR_DEPARTMENT_OTHER): Payer: Self-pay | Admitting: Family

## 2022-07-02 VITALS — BP 144/100 | HR 79 | Ht 67.0 in | Wt 260.7 lb

## 2022-07-02 DIAGNOSIS — I25118 Atherosclerotic heart disease of native coronary artery with other forms of angina pectoris: Secondary | ICD-10-CM

## 2022-07-02 DIAGNOSIS — E785 Hyperlipidemia, unspecified: Secondary | ICD-10-CM

## 2022-07-02 DIAGNOSIS — I10 Essential (primary) hypertension: Secondary | ICD-10-CM

## 2022-07-02 DIAGNOSIS — Z720 Tobacco use: Secondary | ICD-10-CM

## 2022-07-02 DIAGNOSIS — R600 Localized edema: Secondary | ICD-10-CM

## 2022-07-02 MED ORDER — FUROSEMIDE 20 MG PO TABS: 20.0000 mg | ORAL_TABLET | ORAL | 2 refills | Status: DC | PRN

## 2022-07-02 NOTE — Patient Instructions (Addendum)
Medication Instructions:  Your physician has recommended you make the following change in your medication:    CHANGE Amlodipine to 10mg  in the evening  START Furosemide 20mg  as needed for swelling or if you notice a weight gain of 2 pounds overnight or 5 pounds in a week. If you are having to take this more than twice per week please call or send a mychart message.   *If you need a refill on your cardiac medications before your next appointment, please call your pharmacy*   Lab Work: Your physician recommends that you return for lab work today BMP, magnesium, BNP  If you have labs (blood work) drawn today and your tests are completely normal, you will receive your results only by: MyChart Message (if you have MyChart) OR A paper copy in the mail If you have any lab test that is abnormal or we need to change your treatment, we will call you to review the results.   Testing/Procedures: Your EKG today showed normal sinus rhythm.   Your physician has requested that you have an echocardiogram. Echocardiography is a painless test that uses sound waves to create images of your heart. It provides your doctor with information about the size and shape of your heart and how well your heart's chambers and valves are working. This procedure takes approximately one hour. There are no restrictions for this procedure.   Follow-Up: At [facility], you and your health needs are our priority.  As part of our continuing mission to provide you with exceptional heart care, we have created designated Provider Care Teams.  These Care Teams include your primary Cardiologist (physician) and Advanced Practice Providers (APPs -  Physician Assistants and Nurse Practitioners) who all work together to provide you with the care you need, when you need it.  We recommend signing up for the patient portal called "MyChart".  Sign up information is provided on this After Visit Summary.  MyChart is used to connect with  patients for Virtual Visits (Telemedicine).  Patients are able to view lab/test results, encounter notes, upcoming appointments, etc.  Non-urgent messages can be sent to your provider as well.   To learn more about what you can do with MyChart, go to [place].    Your next appointment:   3 month(s)  The format for your next appointment:   In Person  Provider:   [facility], MD or [email or web address], NP    Other Instructions  To prevent or reduce lower extremity swelling: Eat a low salt diet. Salt makes the body hold onto extra fluid which causes swelling. Sit with legs elevated. For example, in the recliner or on an ottoman.  Wear knee-high compression stockings during the daytime. Ones labeled 15-20 mmHg provide good compression.   Heart Healthy Diet Recommendations: A low-salt diet is recommended. Meats should be grilled, baked, or boiled. Avoid fried foods. Focus on lean protein sources like fish or chicken with vegetables and fruits. The American Heart Association is a Olga Millers!  American Heart Association Diet and Lifeystyle Recommendations   Exercise recommendations: The American Heart Association recommends 150 minutes of moderate intensity exercise weekly. Try 30 minutes of moderate intensity exercise 4-5 times per week. This could include walking, jogging, or swimming.  Chronic Venous Insufficiency Chronic venous insufficiency is a condition where the leg veins cannot effectively pump blood from the legs to the heart. This happens when the vein walls are either stretched, weakened, or damaged, or when the valves inside the  vein are damaged. With the right treatment, you should be able to continue with an active life. This condition is also called venous stasis. What are the causes? Common causes of this condition include: High blood pressure inside the veins (venous hypertension). Sitting or standing too long, causing increased blood pressure in the leg  veins. A blood clot that blocks blood flow in a vein (deep vein thrombosis, DVT). Inflammation of a vein (phlebitis) that causes a blood clot to form. Tumors in the pelvis that cause blood to back up. What increases the risk? The following factors may make you more likely to develop this condition: Having a family history of this condition. Obesity. Pregnancy. Living without enough regular physical activity or exercise (sedentary lifestyle). Smoking. Having a job that requires long periods of standing or sitting in one place. Being a certain age. Women in their 56s and 82s and men in their 37s are more likely to develop this condition. What are the signs or symptoms? Symptoms of this condition include: Veins that are enlarged, bulging, or twisted (varicose veins). Skin breakdown or ulcers. Reddened skin or dark discoloration of skin on the leg between the knee and ankle. Brown, smooth, tight, and painful skin just above the ankle, usually on the inside of the leg (lipodermatosclerosis). Swelling of the legs. How is this diagnosed? This condition may be diagnosed based on: Your medical history. A physical exam. Tests, such as: A procedure that creates an image of a blood vessel and nearby organs and provides information about blood flow through the blood vessel (duplex ultrasound). A procedure that tests blood flow (plethysmography). A procedure that looks at the veins using X-ray and dye (venogram). How is this treated? The goals of treatment are to help you return to an active life and to minimize pain or disability. Treatment depends on the severity of your condition, and it may include: Wearing compression stockings. These can help relieve symptoms and help prevent your condition from getting worse. However, they do not cure the condition. Sclerotherapy. This procedure involves an injection of a solution that shrinks damaged veins. Surgery. This may involve: Removing a diseased  vein (vein stripping). Cutting off blood flow through the vein (laser ablation surgery). Repairing or reconstructing a valve within the affected vein. Follow these instructions at home:     Wear compression stockings as told by your health care provider. These stockings help to prevent blood clots and reduce swelling in your legs. Take over-the-counter and prescription medicines only as told by your health care provider. Stay active by exercising, walking, or doing different activities. Ask your health care provider what activities are safe for you and how much exercise you need. Drink enough fluid to keep your urine pale yellow. Do not use any products that contain nicotine or tobacco, such as cigarettes, e-cigarettes, and chewing tobacco. If you need help quitting, ask your health care provider. Keep all follow-up visits as told by your health care provider. This is important. Contact a health care provider if you: Have redness, swelling, or more pain in the affected area. See a red streak or line that goes up or down from the affected area. Have skin breakdown or skin loss in the affected area, even if the breakdown is small. Get an injury in the affected area. Get help right away if: You get an injury and an open wound in the affected area. You have: Severe pain that does not get better with medicine. Sudden numbness or weakness in the  foot or ankle below the affected area. Trouble moving your foot or ankle. A fever. Worse or persistent symptoms. Chest pain. Shortness of breath. Summary Chronic venous insufficiency is a condition where the leg veins cannot effectively pump blood from the legs to the heart. Chronic venous insufficiency occurs when the vein walls become stretched, weakened, or damaged, or when valves within the vein are damaged. Treatment depends on how severe your condition is. It often involves wearing compression stockings and may involve having a procedure. Make  sure you stay active by exercising, walking, or doing different activities. Ask your health care provider what activities are safe for you and how much exercise you need. This information is not intended to replace advice given to you by your health care provider. Make sure you discuss any questions you have with your health care provider. Document Revised: 02/26/2021 Document Reviewed: 02/26/2021 Elsevier Patient Education  2023 [employer].

## 2022-07-03 LAB — BASIC METABOLIC PANEL
BUN/Creatinine Ratio: 11 (ref 9–20)
BUN: 10 mg/dL (ref 6–24)
CO2: 25 mmol/L (ref 20–29)
Calcium: 9.4 mg/dL (ref 8.7–10.2)
Chloride: 98 mmol/L (ref 96–106)
Creatinine, Ser: 0.88 mg/dL (ref 0.76–1.27)
Glucose: 105 mg/dL — ABNORMAL HIGH (ref 70–99)
Potassium: 4.9 mmol/L (ref 3.5–5.2)
Sodium: 136 mmol/L (ref 134–144)
eGFR: 105 mL/min/{1.73_m2} (ref >59)

## 2022-07-03 LAB — BRAIN NATRIURETIC PEPTIDE: BNP: 33.9 pg/mL (ref 0.0–100.0)

## 2022-07-03 LAB — MAGNESIUM: Magnesium: 1.9 mg/dL (ref 1.6–2.3)

## 2022-07-16 ENCOUNTER — Other Ambulatory Visit (HOSPITAL_BASED_OUTPATIENT_CLINIC_OR_DEPARTMENT_OTHER): Payer: Self-pay

## 2022-07-21 ENCOUNTER — Ambulatory Visit (INDEPENDENT_AMBULATORY_CARE_PROVIDER_SITE_OTHER): Payer: BC Managed Care – PPO

## 2022-07-21 ENCOUNTER — Encounter: Payer: Self-pay | Admitting: Medical-Surgical

## 2022-07-21 DIAGNOSIS — R600 Localized edema: Secondary | ICD-10-CM | POA: Diagnosis not present

## 2022-07-21 DIAGNOSIS — I25118 Atherosclerotic heart disease of native coronary artery with other forms of angina pectoris: Secondary | ICD-10-CM | POA: Diagnosis not present

## 2022-07-21 DIAGNOSIS — I10 Essential (primary) hypertension: Secondary | ICD-10-CM | POA: Diagnosis not present

## 2022-07-21 LAB — ECHOCARDIOGRAM COMPLETE
AR max vel: 3.29 cm2
AV Area VTI: 3.21 cm2
AV Area mean vel: 3.17 cm2
AV Mean grad: 4 mmHg
AV Peak grad: 8.1 mmHg
Ao pk vel: 1.42 m/s
Area-P 1/2: 3.74 cm2
S' Lateral: 2.63 cm

## 2022-08-08 ENCOUNTER — Ambulatory Visit (HOSPITAL_BASED_OUTPATIENT_CLINIC_OR_DEPARTMENT_OTHER): Payer: Self-pay | Admitting: Internal Medicine

## 2022-08-18 ENCOUNTER — Encounter: Payer: Self-pay | Admitting: Medical-Surgical

## 2022-09-04 ENCOUNTER — Encounter: Payer: Self-pay | Admitting: Medical-Surgical

## 2022-09-04 ENCOUNTER — Ambulatory Visit (INDEPENDENT_AMBULATORY_CARE_PROVIDER_SITE_OTHER): Payer: Self-pay | Admitting: Medical-Surgical

## 2022-09-04 VITALS — BP 109/71 | HR 75 | Resp 20 | Ht 67.0 in | Wt 252.2 lb

## 2022-09-04 DIAGNOSIS — H6983 Other specified disorders of Eustachian tube, bilateral: Secondary | ICD-10-CM

## 2022-09-04 DIAGNOSIS — E669 Obesity, unspecified: Secondary | ICD-10-CM

## 2022-09-04 DIAGNOSIS — H6690 Otitis media, unspecified, unspecified ear: Secondary | ICD-10-CM

## 2022-09-04 DIAGNOSIS — I872 Venous insufficiency (chronic) (peripheral): Secondary | ICD-10-CM | POA: Insufficient documentation

## 2022-09-04 DIAGNOSIS — H60502 Unspecified acute noninfective otitis externa, left ear: Secondary | ICD-10-CM

## 2022-09-04 DIAGNOSIS — IMO0001 Reserved for inherently not codable concepts without codable children: Secondary | ICD-10-CM | POA: Insufficient documentation

## 2022-09-04 MED ORDER — FLUTICASONE PROPIONATE 50 MCG/ACT NA SUSP: 2.0000 | Freq: Every day | NASAL | 6 refills | Status: DC

## 2022-09-04 MED ORDER — AZITHROMYCIN 250 MG PO TABS: ORAL_TABLET | ORAL | 0 refills | Status: AC

## 2022-09-04 MED ORDER — BUPROPION HCL ER (XL) 150 MG PO TB24: 150.0000 mg | ORAL_TABLET | Freq: Every day | ORAL | 0 refills | Status: DC

## 2022-09-04 MED ORDER — CIPROFLOXACIN-DEXAMETHASONE 0.3-0.1 % OT SUSP: 4.0000 [drp] | Freq: Two times a day (BID) | OTIC | 0 refills | Status: DC

## 2022-09-04 NOTE — Progress Notes (Signed)
Established Patient Office Visit  Subjective   Patient ID: Wesley Duran, male   DOB: Nov 01, 1971 Age: 51 y.o. MRN: [id number]   No chief complaint on file.  HPI Pleasant 51 year old male presenting today for the following:  Reports that about 3 weeks ago, he had some significant sinus congestion that started.  This did progress and finally started to resolve however he started to experience a feeling of fluid behind his ears bilaterally.  His left ear has continued to worsen and over the last week he feels that it is completely blocked and he can no longer hear out of it.  He did try Murine eardrops from over-the-counter but this was unhelpful.  His right ear continues to feel as if there is fluid trapped however he still has hearing on that side.  Has not had any fevers, chills, ear discharge, ear pain, cough, sore throat, and tinnitus.  He has had some intermittent popping on the left side especially with swallowing or pressure changes.  Has been taking an allergy medication but not currently using Flonase.  Has not used any over-the-counter medications to help treat his symptoms outside of the eardrops.  Had originally started Wellbutrin for weight loss efforts however he has not seen much benefit to this.  He does have multiple musculoskeletal issues that are limiting his ability to exercise.  He is very interested in possibly going on weight loss injections once his insurance kicks in.   Objective:    Vitals:   09/04/22 1349  BP: 109/71  Pulse: 75  Resp: 20  Height: 5\' 7"  (1.702 m)  Weight: 252 lb 3.2 oz (114.4 kg)  SpO2: 95%  BMI (Calculated): 39.49    Physical Exam Vitals reviewed.  Constitutional:      General: He is not in acute distress.    Appearance: Normal appearance. He is obese. He is not ill-appearing.  HENT:     Head: Normocephalic.     Right Ear: Hearing, ear canal and external ear normal. A middle ear effusion (Serous) is present. There is no impacted cerumen.      Left Ear: External ear normal. Decreased hearing noted. Swelling present. There is no impacted cerumen.     Ears:     Comments: Left ear canal pale, edematous, no purulent discharge.  Unable to visualize TM. Cardiovascular:     Rate and Rhythm: Normal rate.     Pulses: Normal pulses.     Heart sounds: Normal heart sounds. No murmur heard.    No friction rub. No gallop.  Pulmonary:     Effort: Pulmonary effort is normal. No respiratory distress.     Breath sounds: Normal breath sounds.  Skin:    General: Skin is warm and dry.  Neurological:     Mental Status: He is alert and oriented to person, place, and time.  Psychiatric:        Mood and Affect: Mood normal.        Behavior: Behavior normal.        Thought Content: Thought content normal.        Judgment: Judgment normal.   No results found for this or any previous visit (from the past 24 hour(s)).     The ASCVD Risk score (Arnett DK, et al., 2019) failed to calculate for the following reasons:   The patient has a prior MI or stroke diagnosis   Assessment & Plan:   1. Acute otitis media, unspecified otitis media type Unable to visualize  the left TM.  With the significance of canal edema and hearing loss, we will go ahead and treat empirically with azithromycin given the duration of symptoms without improvement. - azithromycin (ZITHROMAX) 250 MG tablet; Take 2 tablets on day 1, then 1 tablet daily on days 2 through 5  Dispense: 6 tablet; Refill: 0  2. Acute otitis externa of left ear, unspecified type On exam, edema of the ear canal preventing visualization of the TM.  Starting Ciprodex eardrops twice daily for 7 days.  Would like for him to return for a visit for revisualization of his left ear in about 1 week. - ciprofloxacin-dexamethasone (CIPRODEX) OTIC suspension; Place 4 drops into the left ear 2 (two) times daily for 7 days.  Dispense: 7.5 mL; Refill: 0  3. Dysfunction of both eustachian tubes Feel that dysfunction of  the eustachian tubes is contributing to some of his hearing symptoms.  Continue Zyrtec daily.  Start Flonase 2 sprays each nostril daily for the next 2 weeks.  4. Venous insufficiency of both lower extremities Reports that this is a new diagnosis.  Adding to his list today.  5.  Class II obesity Discussed the use of Wellbutrin for weight management.  He has not had great results and would like to go ahead and try coming off the medication while waiting for his insurance to kick in.  Reducing Wellbutrin to 150 mg daily for the next 2 weeks and then to 150 mg every other day for couple of weeks before stopping completely. - buPROPion (WELLBUTRIN XL) 150 MG 24 hr tablet; Take 1 tablet (150 mg total) by mouth daily.  Dispense: 30 tablet; Refill: 0  Return if symptoms worsen or fail to improve.  ___________________________________________ Thayer Ohm, DNP, APRN, FNP-BC Primary Care and Sports Medicine [facility] [place]

## 2022-09-05 ENCOUNTER — Encounter: Payer: Self-pay | Admitting: Medical-Surgical

## 2022-09-05 ENCOUNTER — Other Ambulatory Visit: Payer: Self-pay | Admitting: Medical-Surgical

## 2022-09-05 MED ORDER — NEOMYCIN-POLYMYXIN-HC 3.5-10000-1 OT SOLN: 4.0000 [drp] | Freq: Four times a day (QID) | OTIC | 0 refills | Status: AC

## 2022-09-10 ENCOUNTER — Ambulatory Visit (INDEPENDENT_AMBULATORY_CARE_PROVIDER_SITE_OTHER): Payer: Self-pay | Admitting: Sports Medicine

## 2022-09-10 DIAGNOSIS — M5030 Other cervical disc degeneration, unspecified cervical region: Secondary | ICD-10-CM

## 2022-09-10 DIAGNOSIS — M5412 Radiculopathy, cervical region: Secondary | ICD-10-CM

## 2022-09-10 DIAGNOSIS — M51369 Other intervertebral disc degeneration, lumbar region without mention of lumbar back pain or lower extremity pain: Secondary | ICD-10-CM

## 2022-09-10 DIAGNOSIS — M5136 Other intervertebral disc degeneration, lumbar region: Secondary | ICD-10-CM

## 2022-09-10 MED ORDER — PREGABALIN 300 MG PO CAPS: 300.0000 mg | ORAL_CAPSULE | Freq: Two times a day (BID) | ORAL | 3 refills | Status: DC

## 2022-09-10 NOTE — Progress Notes (Signed)
    Procedures performed today:    None.  Independent interpretation of notes and tests performed by another provider:   None.  Brief History, Exam, Impression, and Recommendations:    Degenerative disc disease, cervical Left periscapular radicular symptoms, C5 C7 DDD on MRI. Increasing Lyrica to 300 twice daily and ordering a left-sided cervical epidural. Return to see me 1 month after injection.  Lumbar degenerative disc disease Wesley Duran also has multilevel lumbar DDD with very large disc protrusions L4-L5 and L5-S1, he has pain across the low back significantly worse with flexion with radiation to the left anterior thigh. The dominant finding on MRI was a left L4-L5 extraforaminal disc protrusion. He also has a fairly large intraspinal disc protrusion at L5-S1, we will start with a left L4-L5 interlaminar epidural.  Chronic process with exacerbation and pharmacologic intervention  ____________________________________________ Wesley Duran. Wesley Duran, M.D., ABFM., CAQSM., AME. 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

## 2022-09-10 NOTE — Assessment & Plan Note (Signed)
Wesley Duran also has multilevel lumbar DDD with very large disc protrusions L4-L5 and L5-S1, he has pain across the low back significantly worse with flexion with radiation to the left anterior thigh. The dominant finding on MRI was a left L4-L5 extraforaminal disc protrusion. He also has a fairly large intraspinal disc protrusion at L5-S1, we will start with a left L4-L5 interlaminar epidural.

## 2022-09-10 NOTE — Assessment & Plan Note (Signed)
Left periscapular radicular symptoms, C5 C7 DDD on MRI. Increasing Lyrica to 300 twice daily and ordering a left-sided cervical epidural. Return to see me 1 month after injection.

## 2022-09-30 ENCOUNTER — Encounter: Payer: Self-pay | Admitting: Medical-Surgical

## 2022-09-30 ENCOUNTER — Telehealth: Payer: Self-pay | Admitting: Medical-Surgical

## 2022-09-30 ENCOUNTER — Encounter: Payer: Self-pay | Admitting: Sports Medicine

## 2022-09-30 ENCOUNTER — Telehealth: Payer: Self-pay

## 2022-09-30 NOTE — Telephone Encounter (Addendum)
Initiated Prior authorization AXE:NMMHWK 0.25MG /0.5ML auto-injectors Via: Covermymeds Case/Key:[id number] Status: approved   as of 10/03/22 Reason: Effective from 10/03/2022 through 02/05/2023. Please note: This medication is to be titrated up in strength every 4 weeks as tolerated by the member. The quantity limit set of 4 pens per 180 days allows for this titration through all strengths using 4 pens of each strength every 28 days. Notified Pt via: Mychart

## 2022-10-02 NOTE — Progress Notes (Signed)
HPI: FU CAD.  Previously followed by Lamar Blinks, MD.  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  He had successful angioplasty at that time and no other obstructive coronary disease was noted; ejection fraction 70%.  Echocardiogram July 2023 showed normal LV function, mild left ventricular hypertrophy, grade 1 diastolic dysfunction, mild right ventricular enlargement.. Since last seen, patient has dyspnea with more vigorous activities.  No chest pain, palpitations, pedal edema.  He does note that he has dizziness with hard coughing spells.  Current Outpatient Medications  Medication Sig Dispense Refill   celecoxib (CELEBREX) 200 MG capsule Take 1 capsule (200 mg total) by mouth 2 (two) times daily. 180 capsule 1   cetirizine (ZYRTEC) 10 MG tablet Take 10 mg by mouth daily.     Evolocumab (REPATHA SURECLICK) [id number] MG/ML SOAJ Inject into the skin.     ezetimibe (ZETIA) 10 MG tablet Take 1 tablet (10 mg total) by mouth daily. 90 tablet 1   fluticasone (FLONASE) 50 MCG/ACT nasal spray Place 2 sprays into both nostrils daily. 16 g 6   metoprolol succinate (TOPROL-XL) 100 MG 24 hr tablet Take 1 tablet (100 mg total) by mouth daily. Take with or immediately following a meal. 90 tablet 1   pregabalin (LYRICA) 300 MG capsule Take 1 capsule (300 mg total) by mouth 2 (two) times daily. 60 capsule 3   Semaglutide,0.25 or 0.5MG /DOS, (OZEMPIC, 0.25 OR 0.5 MG/DOSE,) 2 MG/3ML SOPN Inject 0.5 mg into the skin once a week. Start after completing at least 4 weeks at 0.25mg  weekly. (Patient taking differently: Inject 0.25 mg into the skin once a week. Then increase to 0.5mg  after 4 doses) 3 mL 0   sildenafil (REVATIO) 20 MG tablet Take 1-5 tablets (20-100 mg total) by mouth as needed. MDD: 100 mg 50 tablet 11   traZODone (DESYREL) 100 MG tablet Take 1 tablet (100 mg total) by mouth at bedtime. 90 tablet 1   triamcinolone cream (KENALOG) 0.1  % Apply 1 application. topically 2 (two) times daily. To affected areas on legs 45 g 0   valsartan-hydrochlorothiazide (DIOVAN-HCT) 160-25 MG tablet Take 1 tablet by mouth daily. 90 tablet 3   amLODipine (NORVASC) 10 MG tablet Take 1 tablet (10 mg total) by mouth daily. 90 tablet 1   furosemide (LASIX) 20 MG tablet Take 1 tablet (20 mg total) by mouth as needed. 30 tablet 2   No current facility-administered medications for this visit.     Past Medical History:  Diagnosis Date   Arthritis    CAD (coronary artery disease)    Heart attack ([place])    Hyperlipidemia    Hypertension    Sleep apnea     Past Surgical History:  Procedure Laterality Date   NO PAST SURGERIES      Social History   Socioeconomic History   Marital status: Married    Spouse name: Not on file   Number of children: Not on file   Years of education: Not on file   Highest education level: Not on file  Occupational History   Not on file  Tobacco Use   Smoking status: Every Day    Packs/day: 1.00    Years: 30.00    Total pack years: 30.00    Types: Cigarettes   Smokeless tobacco: Never   Tobacco comments:    1 pack last 2-3 days   Vaping Use   Vaping Use: Never  used  Substance and Sexual Activity   Alcohol use: Yes    Alcohol/week: 10.0 - 15.0 standard drinks of alcohol    Types: 10 - 15 Shots of liquor per week    Comment: 1-2 drinks per night   Drug use: Never   Sexual activity: Yes    Partners: Female    Birth control/protection: None  Other Topics Concern   Not on file  Social History Narrative   Not on file   Social Determinants of Health   Financial Resource Strain: Not on file  Food Insecurity: Not on file  Transportation Needs: Not on file  Physical Activity: Not on file  Stress: Not on file  Social Connections: Not on file  Intimate Partner Violence: Not on file    Family History  Problem Relation Age of Onset   Hypertension Mother    Diabetes Father    Heart failure  Father    Heart attack Father    Leukemia Maternal Grandmother    Prostate cancer Maternal Grandfather     ROS: Back and knee pain but no fevers or chills, productive cough, hemoptysis, dysphasia, odynophagia, melena, hematochezia, dysuria, hematuria, rash, seizure activity, orthopnea, PND, pedal edema, claudication. Remaining systems are negative.  Physical Exam: Well-developed well-nourished in no acute distress.  Skin is warm and dry.  HEENT is normal.  Neck is supple.  Chest is clear to auscultation with normal expansion.  Cardiovascular exam is regular rate and rhythm.  Abdominal exam nontender or distended. No masses palpated. Extremities show no edema. neuro grossly intact  A/P  1 coronary artery disease-patient doing well from a symptomatic standpoint.  Add aspirin 81 mg daily.  Intolerant to statins.  2 hypertension-blood pressure elevated.  However he states it is typically controlled.  We will continue current medications and follow.  3 hyperlipidemia-intolerant to statins.  Continue Repatha and Zetia.  4 tobacco abuse-patient counseled on discontinuing.  5 lower extremity edema-continue Lasix as needed.  6 near syncope-he is having dizzy episodes with heart spells of coughing.  This is likely cough mediated presyncope.  I have asked him to follow-up with primary care for treatment of his allergies and cough.  I also explained that decreasing tobacco use would help with this as well.  Kirk Ruths, MD

## 2022-10-03 ENCOUNTER — Other Ambulatory Visit: Payer: Self-pay | Admitting: Medical-Surgical

## 2022-10-03 MED ORDER — WEGOVY 0.25 MG/0.5ML ~~LOC~~ SOAJ
0.2500 mg | SUBCUTANEOUS | 0 refills | Status: DC
Start: 2022-10-03 — End: 2022-10-10

## 2022-10-03 NOTE — Progress Notes (Signed)
   Patient: Wesley Duran  1971-08-23  After much discussion of weight loss options, attempting to start Wegovy at 0.25 mg weekly with up taper every 4 weeks as tolerated to a maximum of 2.4 mg weekly.  Spoke with Lujean Rave at [facility] and received a fax for prior review/certification.  Form has been completed with an expected turnaround of 72 business hours.  To recap, patient has tried Wellbutrin in the past with minimal results.  He cannot take phentermine due to his cardiac status which effectively and illuminates both phentermine and Qsymia.  Stopped Wellbutrin as it was ineffective for the weight loss and also had some side effects that were unpleasant.  Being intolerant to Wellbutrin also removes Contrave from the options.  He currently has a BMI of 39 and has been unable to lose weight with dietary measures alone.  He has multiple musculoskeletal issues and has severe knee problems that limit his mobility.  Weight loss is highly recommended to help with this.  In addition he has a number of other health problems that are also related to his obesity.  Please see the problem list below.  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] 05/23/2022   Trouble in sleeping [G47.9] 05/23/2022   Class 2 obesity due to excess calories with body mass index (BMI) of 39.0 to 39.9 in adult [E66.09, Z68.39] 05/23/2022   Tobacco abuse counseling [Z71.6] 05/23/2022   Primary osteoarthritis of both hands [M19.041, M19.042] 10/29/2021   Degenerative disc disease, cervical [M50.30] 10/29/2021   Lumbar degenerative disc disease [M51.36] 10/29/2021   Obesity (BMI 30-39.9) [E66.9] 10/29/2021   Myalgia [M79.10] 12/06/2018   NSTEMI (non-ST elevated myocardial infarction) ([place]) [I21.4] 03/13/2016   Essential hypertension [I10] 03/11/2016   Cigarette nicotine  dependence without complication [G25.427] 06/21/7627   Cystic acne [L70.0] 01/25/2014    It is my medical recommendation at this time that we start Wegovy to optimize weight loss and benefit his chronic health problems.  Please fax this note along with the requested form for review by insurance for their final determination.  ___________________________________________ Clearnce Sorrel, DNP, APRN, FNP-BC Primary Care and Sports Medicine [place]

## 2022-10-03 NOTE — Progress Notes (Signed)
Printed notes and faxed with form

## 2022-10-07 ENCOUNTER — Encounter (HOSPITAL_BASED_OUTPATIENT_CLINIC_OR_DEPARTMENT_OTHER): Payer: Self-pay | Admitting: Internal Medicine

## 2022-10-08 ENCOUNTER — Telehealth: Payer: Self-pay

## 2022-10-08 DIAGNOSIS — I10 Essential (primary) hypertension: Secondary | ICD-10-CM | POA: Diagnosis not present

## 2022-10-08 DIAGNOSIS — R569 Unspecified convulsions: Secondary | ICD-10-CM | POA: Diagnosis not present

## 2022-10-08 DIAGNOSIS — Z7982 Long term (current) use of aspirin: Secondary | ICD-10-CM | POA: Diagnosis not present

## 2022-10-08 DIAGNOSIS — Z79899 Other long term (current) drug therapy: Secondary | ICD-10-CM | POA: Diagnosis not present

## 2022-10-08 DIAGNOSIS — F17210 Nicotine dependence, cigarettes, uncomplicated: Secondary | ICD-10-CM | POA: Diagnosis not present

## 2022-10-08 DIAGNOSIS — Z7902 Long term (current) use of antithrombotics/antiplatelets: Secondary | ICD-10-CM | POA: Diagnosis not present

## 2022-10-08 DIAGNOSIS — E871 Hypo-osmolality and hyponatremia: Secondary | ICD-10-CM | POA: Diagnosis not present

## 2022-10-08 NOTE — Telephone Encounter (Signed)
Wesley Duran called and states he had a seizure last night. I advised him to go to the ED. We have no openings.

## 2022-10-10 ENCOUNTER — Telehealth: Payer: Self-pay | Admitting: General Practice

## 2022-10-10 ENCOUNTER — Ambulatory Visit
Admission: RE | Admit: 2022-10-10 | Discharge: 2022-10-10 | Disposition: A | Discharge status: Home / Self Care | Payer: BC Managed Care – PPO | Source: Ambulatory Visit | Attending: Sports Medicine | Admitting: Sports Medicine

## 2022-10-10 ENCOUNTER — Telehealth: Payer: Self-pay

## 2022-10-10 DIAGNOSIS — M5030 Other cervical disc degeneration, unspecified cervical region: Secondary | ICD-10-CM

## 2022-10-10 DIAGNOSIS — M4722 Other spondylosis with radiculopathy, cervical region: Secondary | ICD-10-CM | POA: Diagnosis not present

## 2022-10-10 MED ORDER — OZEMPIC (0.25 OR 0.5 MG/DOSE) 2 MG/3ML ~~LOC~~ SOPN: 0.5000 mg | PEN_INJECTOR | SUBCUTANEOUS | 0 refills | Status: DC

## 2022-10-10 MED ORDER — IOPAMIDOL (ISOVUE-M 300) INJECTION 61%
1.0000 mL | Freq: Once | INTRAMUSCULAR | Status: AC | PRN
  Administered 2022-10-10: 1 mL via EPIDURAL

## 2022-10-10 MED ORDER — TRIAMCINOLONE ACETONIDE 40 MG/ML IJ SUSP (RADIOLOGY)
60.0000 mg | Freq: Once | INTRAMUSCULAR | Status: AC
  Administered 2022-10-10: 60 mg via EPIDURAL

## 2022-10-10 NOTE — Telephone Encounter (Signed)
Initiated Prior authorization FYT:[id number] (0.25 or 0.5 MG/DOSE) 2MG /3ML pen-injectors Via: Covermymeds Case/Key:[id number] Status: approved  as of 10/10/22 Reason:Effective from 10/10/2022 through 10/09/2023. Notified Pt via: Mychart

## 2022-10-10 NOTE — Discharge Instructions (Signed)

## 2022-10-10 NOTE — Telephone Encounter (Signed)
Transition Care Management Unsuccessful Follow-up Telephone Call  Date of discharge and from where:  10/08/22 from [facility]  Attempts:  1st Attempt  Reason for unsuccessful TCM follow-up call:  Left voice message    

## 2022-10-15 ENCOUNTER — Ambulatory Visit (INDEPENDENT_AMBULATORY_CARE_PROVIDER_SITE_OTHER): Payer: BC Managed Care – PPO | Admitting: Cardiology

## 2022-10-15 ENCOUNTER — Encounter: Payer: Self-pay | Admitting: Cardiology

## 2022-10-15 VITALS — BP 142/92 | HR 89 | Ht 67.0 in | Wt 256.0 lb

## 2022-10-15 DIAGNOSIS — I10 Essential (primary) hypertension: Secondary | ICD-10-CM

## 2022-10-15 DIAGNOSIS — E785 Hyperlipidemia, unspecified: Secondary | ICD-10-CM | POA: Diagnosis not present

## 2022-10-15 DIAGNOSIS — Z720 Tobacco use: Secondary | ICD-10-CM | POA: Diagnosis not present

## 2022-10-15 DIAGNOSIS — I25118 Atherosclerotic heart disease of native coronary artery with other forms of angina pectoris: Secondary | ICD-10-CM

## 2022-10-15 MED ORDER — ASPIRIN 81 MG PO TBEC: 81.0000 mg | DELAYED_RELEASE_TABLET | Freq: Every day | ORAL | 3 refills | Status: AC

## 2022-10-15 NOTE — Patient Instructions (Signed)
  Follow-Up: At [facility], you and your health needs are our priority.  As part of our continuing mission to provide you with exceptional heart care, we have created designated Provider Care Teams.  These Care Teams include your primary Cardiologist (physician) and Advanced Practice Providers (APPs -  Physician Assistants and Nurse Practitioners) who all work together to provide you with the care you need, when you need it.  We recommend signing up for the patient portal called "MyChart".  Sign up information is provided on this After Visit Summary.  MyChart is used to connect with patients for Virtual Visits (Telemedicine).  Patients are able to view lab/test results, encounter notes, upcoming appointments, etc.  Non-urgent messages can be sent to your provider as well.   To learn more about what you can do with MyChart, go to [email or web address].    Your next appointment:   12 month(s)  The format for your next appointment:   In Person  Provider:   Brian Crenshaw, MD   

## 2022-10-16 ENCOUNTER — Other Ambulatory Visit (HOSPITAL_BASED_OUTPATIENT_CLINIC_OR_DEPARTMENT_OTHER): Payer: Self-pay | Admitting: Family

## 2022-10-16 DIAGNOSIS — R600 Localized edema: Secondary | ICD-10-CM

## 2022-10-16 DIAGNOSIS — I10 Essential (primary) hypertension: Secondary | ICD-10-CM

## 2022-10-20 NOTE — Telephone Encounter (Signed)
Transition Care Management Unsuccessful Follow-up Telephone Call  Date of discharge and from where:  10/08/22 from [facility]  Attempts:  2nd Attempt  Reason for unsuccessful TCM follow-up call:  Left voice message    

## 2022-10-21 ENCOUNTER — Encounter: Payer: Self-pay | Admitting: Medical-Surgical

## 2022-10-21 DIAGNOSIS — I10 Essential (primary) hypertension: Secondary | ICD-10-CM

## 2022-10-21 MED ORDER — METOPROLOL SUCCINATE ER 100 MG PO TB24: 100.0000 mg | ORAL_TABLET | Freq: Every day | ORAL | 1 refills | Status: DC

## 2022-10-22 NOTE — Telephone Encounter (Signed)
Transition Care Management Unsuccessful Follow-up Telephone Call  Date of discharge and from where:  10/08/22 from [facility]  Attempts:  3rd Attempt  Reason for unsuccessful TCM follow-up call:  Left voice message

## 2022-10-27 DIAGNOSIS — H25813 Combined forms of age-related cataract, bilateral: Secondary | ICD-10-CM | POA: Diagnosis not present

## 2022-10-27 DIAGNOSIS — H0288A Meibomian gland dysfunction right eye, upper and lower eyelids: Secondary | ICD-10-CM | POA: Diagnosis not present

## 2022-10-27 DIAGNOSIS — H04123 Dry eye syndrome of bilateral lacrimal glands: Secondary | ICD-10-CM | POA: Diagnosis not present

## 2022-10-27 DIAGNOSIS — H0288B Meibomian gland dysfunction left eye, upper and lower eyelids: Secondary | ICD-10-CM | POA: Diagnosis not present

## 2022-10-29 ENCOUNTER — Ambulatory Visit
Admission: RE | Admit: 2022-10-29 | Discharge: 2022-10-29 | Disposition: A | Discharge status: Home / Self Care | Payer: BC Managed Care – PPO | Source: Ambulatory Visit | Attending: Sports Medicine | Admitting: Sports Medicine

## 2022-10-29 DIAGNOSIS — M5136 Other intervertebral disc degeneration, lumbar region: Secondary | ICD-10-CM

## 2022-10-29 DIAGNOSIS — M4727 Other spondylosis with radiculopathy, lumbosacral region: Secondary | ICD-10-CM | POA: Diagnosis not present

## 2022-10-29 MED ORDER — IOPAMIDOL (ISOVUE-M 200) INJECTION 41%
1.0000 mL | Freq: Once | INTRAMUSCULAR | Status: AC
  Administered 2022-10-29: 1 mL via EPIDURAL

## 2022-10-29 MED ORDER — METHYLPREDNISOLONE ACETATE 40 MG/ML INJ SUSP (RADIOLOG
80.0000 mg | Freq: Once | INTRAMUSCULAR | Status: AC
  Administered 2022-10-29: 80 mg via EPIDURAL

## 2022-10-29 NOTE — Discharge Instructions (Signed)

## 2022-11-04 ENCOUNTER — Encounter: Payer: Self-pay | Admitting: *Deleted

## 2022-11-04 DIAGNOSIS — Z006 Encounter for examination for normal comparison and control in clinical research program: Secondary | ICD-10-CM

## 2022-11-04 NOTE — Research (Signed)
Message left for Mr [facility] about Core research. Encouraged him to call back if he is interested.

## 2022-11-13 ENCOUNTER — Encounter (INDEPENDENT_AMBULATORY_CARE_PROVIDER_SITE_OTHER): Payer: Self-pay

## 2022-11-13 ENCOUNTER — Ambulatory Visit (INDEPENDENT_AMBULATORY_CARE_PROVIDER_SITE_OTHER): Payer: BC Managed Care – PPO | Admitting: Medical-Surgical

## 2022-11-13 ENCOUNTER — Encounter: Payer: Self-pay | Admitting: Medical-Surgical

## 2022-11-13 VITALS — BP 122/75 | HR 84 | Resp 20 | Ht 67.0 in | Wt 250.6 lb

## 2022-11-13 DIAGNOSIS — B369 Superficial mycosis, unspecified: Secondary | ICD-10-CM

## 2022-11-13 DIAGNOSIS — G479 Sleep disorder, unspecified: Secondary | ICD-10-CM

## 2022-11-13 DIAGNOSIS — H6242 Otitis externa in other diseases classified elsewhere, left ear: Secondary | ICD-10-CM | POA: Diagnosis not present

## 2022-11-13 DIAGNOSIS — R0683 Snoring: Secondary | ICD-10-CM | POA: Diagnosis not present

## 2022-11-13 NOTE — Progress Notes (Signed)
   Established Patient Office Visit  Subjective   Patient ID: Wesley Duran, male   DOB: [birth year] Age: 51 y.o. MRN: [id number]   Chief Complaint  Patient presents with   Ear Fullness    HPI Pleasant 51 year old male  presenting today for re-evaluation of his left ear. Continues to have issues with intermittent left ear symptoms and notes that the previous antibiotic drops and ear cleaning drops have not helped.  Continues to feel like the ear opens for several days and then closes up again.  He does have hearing loss when his ear feels close to.  He has drainage from the ear that is considered thin, light brown and a little lighter than the color of earwax.  He reports it feels wet inside his ear all the time although he is studiously working to avoid getting water in ear.  He does endorse popping but no ringing or ear pain.  We have discussed the potential for sleep apnea several times but he has never completed a sleep study.  He has finally decided that he is interested in having this done.  Started the Ozempic finally.  Has done a couple of the injections so far and notes some mild nausea but nothing too major.   Objective:    Vitals:   11/13/22 1005  BP: 122/75  Pulse: 84  Resp: 20  Height: 5\' 7"  (1.702 m)  Weight: 250 lb 9.6 oz (113.7 kg)  SpO2: 94%  BMI (Calculated): 39.24    Physical Exam Vitals reviewed.  Constitutional:      General: He is not in acute distress.    Appearance: Normal appearance. He is obese. He is not ill-appearing.  HENT:     Head: Normocephalic and atraumatic.  Cardiovascular:     Rate and Rhythm: Normal rate and regular rhythm.     Pulses: Normal pulses.     Heart sounds: Normal heart sounds. No murmur heard.    No friction rub. No gallop.  Pulmonary:     Effort: Pulmonary effort is normal. No respiratory distress.     Breath sounds: Normal breath sounds.  Skin:    General: Skin is warm and dry.  Neurological:     Mental Status: He is alert  and oriented to person, place, and time.  Psychiatric:        Mood and Affect: Mood normal.        Behavior: Behavior normal.        Thought Content: Thought content normal.        Judgment: Judgment normal.   No results found for this or any previous visit (from the past 24 hour(s)).     The ASCVD Risk score (Arnett DK, et al., 2019) failed to calculate for the following reasons:   The patient has a prior MI or stroke diagnosis   Assessment & Plan:   1. Otomycosis of left ear Urgently referring to ENT.  Advised patient to avoid any moisture in the ear and avoid using Q-tips. - Ambulatory referral to ENT  2. Trouble in sleeping 3. Loud snoring Split-night sleep study with CPAP titration if indicated ordered. - Split night study; Future  Return if symptoms worsen or fail to improve.  ___________________________________________ 2020, DNP, APRN, FNP-BC Primary Care and Sports Medicine [facility] [place]

## 2022-11-14 ENCOUNTER — Encounter (HOSPITAL_BASED_OUTPATIENT_CLINIC_OR_DEPARTMENT_OTHER): Payer: Self-pay | Admitting: Internal Medicine

## 2022-11-14 ENCOUNTER — Ambulatory Visit (INDEPENDENT_AMBULATORY_CARE_PROVIDER_SITE_OTHER): Payer: BC Managed Care – PPO | Admitting: Internal Medicine

## 2022-11-14 VITALS — BP 118/77 | HR 76 | Ht 67.0 in | Wt 250.8 lb

## 2022-11-14 DIAGNOSIS — I10 Essential (primary) hypertension: Secondary | ICD-10-CM

## 2022-11-14 DIAGNOSIS — E785 Hyperlipidemia, unspecified: Secondary | ICD-10-CM | POA: Diagnosis not present

## 2022-11-14 DIAGNOSIS — E781 Pure hyperglyceridemia: Secondary | ICD-10-CM

## 2022-11-14 DIAGNOSIS — I25118 Atherosclerotic heart disease of native coronary artery with other forms of angina pectoris: Secondary | ICD-10-CM

## 2022-11-14 NOTE — Patient Instructions (Signed)
Medication Instructions:  NO CHANGES  *If you need a refill on your cardiac medications before your next appointment, please call your pharmacy*   Lab Work: NMR lipoprofile, LPa today   If you have labs (blood work) drawn today and your tests are completely normal, you will receive your results only by: MyChart Message (if you have MyChart) OR A paper copy in the mail If you have any lab test that is abnormal or we need to change your treatment, we will call you to review the results.   Follow-Up: At [facility], you and your health needs are our priority.  As part of our continuing mission to provide you with exceptional heart care, we have created designated Provider Care Teams.  These Care Teams include your primary Cardiologist (physician) and Advanced Practice Providers (APPs -  Physician Assistants and Nurse Practitioners) who all work together to provide you with the care you need, when you need it.  We recommend signing up for the patient portal called "MyChart".  Sign up information is provided on this After Visit Summary.  MyChart is used to connect with patients for Virtual Visits (Telemedicine).  Patients are able to view lab/test results, encounter notes, upcoming appointments, etc.  Non-urgent messages can be sent to your provider as well.   To learn more about what you can do with MyChart, go to [email or web address].    Your next appointment:    12 months with Dr. Rennis Golden

## 2022-11-14 NOTE — Progress Notes (Signed)
LIPID CLINIC CONSULT NOTE  Chief Complaint:  No complaints  Primary Care Physician: Wesley Butter, NP  Primary Cardiologist:  Wesley Millers, MD  HPI:  Wesley Duran is a 51 y.o. male who is being seen today for the evaluation of dyslipidemia at the request of Wesley Butter, NP. This is a pleasant male previously seen by Dr. Jens Duran earlier this year in [place] with a history of non-ST elevation MI and plain old balloon angioplasty to a branch vessel in 2017.  He denies a prior stent.  He has not had any further chest pain since then.  Other risk factors include dyslipidemia, hypertension, tobacco use and sleep apnea.  More recently he has had dietary indiscretions.  He says he has purchased a smoker and is eating a lot more smoked and fatty meats.  He also reports alcohol use at least several drinks per week.  He said he has been intolerant to statins in the past which both caused myalgias and subsequently has been on ezetimibe.  Recent labs in October showed total cholesterol 279, HDL 38, triglycerides 668 and LDL was not calculated.  Hemoglobin A1c was 5.9.  He had a discussion with his primary care provider and is made dietary changes, however he will likely need additional medical therapy.  11/14/2022  Wesley Duran is seen today in follow-up. He recently saw Dr. Jens Duran.  He reports tolerating Zetia and Repatha without issues.  He is made dietary changes.  He is eager to see the effects that has had on his cholesterol.  He has not had that reassessed since March which time his triglycerides were extremely high.  Reports receiving a grant from [employer] which gives him the medications for free.  PMHx:  Past Medical History:  Diagnosis Date   Arthritis    CAD (coronary artery disease)    Heart attack (HCC)    Hyperlipidemia    Hypertension    Sleep apnea     Past Surgical History:  Procedure Laterality Date   NO PAST SURGERIES      FAMHx:  Family History  Problem Relation Age of  Onset   Hypertension Mother    Diabetes Father    Heart failure Father    Heart attack Father    Leukemia Maternal Grandmother    Prostate cancer Maternal Grandfather     SOCHx:   reports that he has been smoking cigarettes. He has a 30.00 pack-year smoking history. He has never used smokeless tobacco. He reports current alcohol use of about 10.0 - 15.0 standard drinks of alcohol per week. He reports that he does not use drugs.  ALLERGIES:  Allergies  Allergen Reactions   Statins Other (See Comments)    Myalgias    ROS: Pertinent items noted in HPI and remainder of comprehensive ROS otherwise negative.  HOME MEDS: Current Outpatient Medications on File Prior to Visit  Medication Sig Dispense Refill   amLODipine (NORVASC) 10 MG tablet Take 1 tablet (10 mg total) by mouth daily. 90 tablet 1   aspirin EC 81 MG tablet Take 1 tablet (81 mg total) by mouth daily. Swallow whole. 90 tablet 3   celecoxib (CELEBREX) 200 MG capsule Take 1 capsule (200 mg total) by mouth 2 (two) times daily. 180 capsule 1   cetirizine (ZYRTEC) 10 MG tablet Take 10 mg by mouth daily.     Evolocumab (REPATHA SURECLICK) 140 MG/ML SOAJ Inject into the skin.     ezetimibe (ZETIA) 10 MG tablet Take 1 tablet (  10 mg total) by mouth daily. 90 tablet 1   fluticasone (FLONASE) 50 MCG/ACT nasal spray Place 2 sprays into both nostrils daily. 16 g 6   furosemide (LASIX) 20 MG tablet TAKE 1 TABLET BY MOUTH AS NEEDED 30 tablet 3   metoprolol succinate (TOPROL-XL) 100 MG 24 hr tablet Take 1 tablet (100 mg total) by mouth daily. Take with or immediately following a meal. 90 tablet 1   pregabalin (LYRICA) 300 MG capsule Take 1 capsule (300 mg total) by mouth 2 (two) times daily. 60 capsule 3   Semaglutide,0.25 or 0.5MG /DOS, (OZEMPIC, 0.25 OR 0.5 MG/DOSE,) 2 MG/3ML SOPN Inject 0.5 mg into the skin once a week. Start after completing at least 4 weeks at 0.25mg  weekly. (Patient taking differently: Inject 0.25 mg into the skin once  a week. Then increase to 0.5mg  after 4 doses) 3 mL 0   sildenafil (REVATIO) 20 MG tablet Take 1-5 tablets (20-100 mg total) by mouth as needed. MDD: 100 mg 50 tablet 11   traZODone (DESYREL) 100 MG tablet Take 1 tablet (100 mg total) by mouth at bedtime. 90 tablet 1   triamcinolone cream (KENALOG) 0.1 % Apply 1 application. topically 2 (two) times daily. To affected areas on legs 45 g 0   valsartan-hydrochlorothiazide (DIOVAN-HCT) 160-25 MG tablet Take 1 tablet by mouth daily. 90 tablet 3   No current facility-administered medications on file prior to visit.    LABS/IMAGING: No results found for this or any previous visit (from the past 48 hour(s)). No results found.  LIPID PANEL:    Component Value Date/Time   CHOL 279 (H) 10/02/2021 0853   TRIG 668 (H) 10/02/2021 0853   HDL 38 (L) 10/02/2021 0853   CHOLHDL 7.3 (H) 10/02/2021 0853   LDLCALC  10/02/2021 0853     Comment:     . LDL cholesterol not calculated. Triglyceride levels greater than 400 mg/dL invalidate calculated LDL results. . Reference range: <100 . Desirable range <100 mg/dL for primary prevention;   <70 mg/dL for patients with CHD or diabetic patients  with > or = 2 CHD risk factors. Wesley Duran LDL-C is now calculated using the Martin-Hopkins  calculation, which is a validated novel method providing  better accuracy than the Friedewald equation in the  estimation of LDL-C.  Horald Pollen et al. Wesley Duran. 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   Mr. Nabers has been compliant with Repatha and ezetimibe.  He says he is also made dietary and lifestyle modifications.  We will plan a repeat lipid profile today.  He is fasting.  Check an LP(a) as well.  Out to him about research 2 weeks ago but he has not enrolled in the trial.  Therefore it would be okay to check his labs today.  If triglycerides remain elevated, I would  encourage him to consider this again.  Plan follow-up with me annually or sooner as necessary.  Chrystie Nose, MD, [facility], FACP  [place]  [facility]  Medical Director of the Advanced Lipid Disorders &  Cardiovascular Risk Reduction Clinic Diplomate of the American Board of Clinical Lipidology Attending Cardiologist  Direct Dial: [phone]  Fax: [phone]  Website:  [email or web address] Nicely Anisa Leanos 11/14/2022, 9:16 AM

## 2022-11-16 LAB — NMR, LIPOPROFILE
Cholesterol, Total: 121 mg/dL (ref 100–199)
HDL Particle Number: 34.5 umol/L (ref >=30.5)
HDL-C: 42 mg/dL (ref >39)
LDL Particle Number: 880 nmol/L (ref <1000)
LDL Size: 19.7 nm — ABNORMAL LOW (ref >20.5)
LDL-C (NIH Calc): 47 mg/dL (ref 0–99)
LP-IR Score: 96 — ABNORMAL HIGH (ref <=45)
Small LDL Particle Number: 746 nmol/L — ABNORMAL HIGH (ref <=527)
Triglycerides: 198 mg/dL — ABNORMAL HIGH (ref 0–149)

## 2022-11-16 LAB — LIPOPROTEIN A (LPA): Lipoprotein (a): 9.1 nmol/L (ref <75.0)

## 2022-11-25 ENCOUNTER — Telehealth: Payer: Self-pay

## 2022-11-25 ENCOUNTER — Encounter: Payer: Self-pay | Admitting: Medical-Surgical

## 2022-11-25 MED ORDER — TIRZEPATIDE 2.5 MG/0.5ML ~~LOC~~ SOAJ: 2.5000 mg | SUBCUTANEOUS | 0 refills | Status: DC

## 2022-11-25 MED ORDER — TIRZEPATIDE 7.5 MG/0.5ML ~~LOC~~ SOAJ: 7.5000 mg | SUBCUTANEOUS | 0 refills | Status: DC

## 2022-11-25 MED ORDER — TIRZEPATIDE 5 MG/0.5ML ~~LOC~~ SOAJ: 5.0000 mg | SUBCUTANEOUS | 0 refills | Status: DC

## 2022-11-25 NOTE — Telephone Encounter (Addendum)
Initiated Prior authorization NOI:[id number] 2.5MG /0.5ML pen-injectors Via: Covermymeds Case/Key:[id number]  Status: approved  as of 11/25/22 Reason:Effective from 11/25/2022 through 11/24/2023. Notified Pt via: Mychart

## 2022-11-27 DIAGNOSIS — B369 Superficial mycosis, unspecified: Secondary | ICD-10-CM | POA: Diagnosis not present

## 2022-12-02 DIAGNOSIS — H02024 Mechanical entropion of left upper eyelid: Secondary | ICD-10-CM | POA: Diagnosis not present

## 2022-12-02 DIAGNOSIS — H0288A Meibomian gland dysfunction right eye, upper and lower eyelids: Secondary | ICD-10-CM | POA: Diagnosis not present

## 2022-12-02 DIAGNOSIS — H25813 Combined forms of age-related cataract, bilateral: Secondary | ICD-10-CM | POA: Diagnosis not present

## 2022-12-02 DIAGNOSIS — H0288B Meibomian gland dysfunction left eye, upper and lower eyelids: Secondary | ICD-10-CM | POA: Diagnosis not present

## 2022-12-02 DIAGNOSIS — H04123 Dry eye syndrome of bilateral lacrimal glands: Secondary | ICD-10-CM | POA: Diagnosis not present

## 2022-12-24 ENCOUNTER — Other Ambulatory Visit: Payer: Self-pay | Admitting: Family Medicine

## 2022-12-25 ENCOUNTER — Encounter: Payer: Self-pay | Admitting: Medical-Surgical

## 2023-01-01 ENCOUNTER — Telehealth: Payer: BC Managed Care – PPO | Admitting: Medical-Surgical

## 2023-01-07 ENCOUNTER — Encounter: Payer: Self-pay | Admitting: Medical-Surgical

## 2023-01-07 ENCOUNTER — Telehealth (INDEPENDENT_AMBULATORY_CARE_PROVIDER_SITE_OTHER): Payer: BC Managed Care – PPO | Admitting: Medical-Surgical

## 2023-01-07 DIAGNOSIS — R55 Syncope and collapse: Secondary | ICD-10-CM

## 2023-01-07 DIAGNOSIS — M5030 Other cervical disc degeneration, unspecified cervical region: Secondary | ICD-10-CM

## 2023-01-07 DIAGNOSIS — M5136 Other intervertebral disc degeneration, lumbar region: Secondary | ICD-10-CM | POA: Diagnosis not present

## 2023-01-07 DIAGNOSIS — M791 Myalgia, unspecified site: Secondary | ICD-10-CM

## 2023-01-07 DIAGNOSIS — M19041 Primary osteoarthritis, right hand: Secondary | ICD-10-CM | POA: Diagnosis not present

## 2023-01-07 DIAGNOSIS — M19042 Primary osteoarthritis, left hand: Secondary | ICD-10-CM

## 2023-01-07 DIAGNOSIS — I10 Essential (primary) hypertension: Secondary | ICD-10-CM

## 2023-01-07 DIAGNOSIS — R054 Cough syncope: Secondary | ICD-10-CM

## 2023-01-07 MED ORDER — AMLODIPINE BESYLATE 10 MG PO TABS: 10.0000 mg | ORAL_TABLET | Freq: Every day | ORAL | 1 refills | Status: DC

## 2023-01-07 MED ORDER — PROMETHAZINE-DM 6.25-15 MG/5ML PO SYRP: 5.0000 mL | ORAL_SOLUTION | Freq: Four times a day (QID) | ORAL | 0 refills | Status: DC | PRN

## 2023-01-07 NOTE — Progress Notes (Signed)
Virtual Visit via Video Note  I connected with Wesley Duran on 01/07/23 at 10:30 AM EST by a video enabled telemedicine application and verified that I am speaking with the correct person using two identifiers.   I discussed the limitations of evaluation and management by telemedicine and the availability of in person appointments. The patient expressed understanding and agreed to proceed.  Patient location: home Provider locations: office  Subjective:    CC: Cough  HPI: Pleasant 52 year old male presenting via MyChart video visit with reports of recent issues with upper respiratory symptoms.  Over the last 3 weeks, he has had sinus congestion, coughing, chest congestion, and other viral symptoms.  Notes that the symptoms are finally starting to get better and that Robitussin DM has helped a little bit.  Unfortunately, with his cough being so severe, he had another episode of cough syncope.  He reports that he was unconscious for approximately 1 minute.  Fortunately he was in the seated position so there was no fall or injury associated.  His wife notes that when he was "out" he looked like he was having some small foaming at the mouth as well as unusual movements of his upper extremities and head.  He has never been seen by neurology for possible seizure workup.  No other symptoms or history of seizure-like activity.  Requesting to have a cough medicine on hand to use in cases where coughing becomes so severe that he gets near syncope.  Continues to have significant chronic pain that limits his ability to function on a daily basis.  He is currently taking pregabalin as well as NSAIDs but these only provide minimal to moderate relief of pain.  Is interested in having something on hand to help with pain control for days when nothing seems to help.  Hypertension: Reports that he is doing well on his medications however he is in need of a refill of amlodipine and would like to have it sent in  today.  Weight/prediabetes: Reports that he has been taking Mounjaro 5 mg daily, tolerating well without side effects.  Notes approximately 8 pounds lost so far.  Unfortunately, it looks like his insurance is not going to honor the $25 co-pay card as they have their own discount in the system.  It looks like the cost is going to be approximately $150-$160 monthly for him and this is not financially feasible.  Past medical history, Surgical history, Family history not pertinant except as noted below, Social history, Allergies, and medications have been entered into the medical record, reviewed, and corrections made.   Review of Systems: See HPI for pertinent positives and negatives.   Objective:    General: Speaking clearly in complete sentences without any shortness of breath.  Alert and oriented x3.  Normal judgment. No apparent acute distress.  Impression and Recommendations:    1. Essential hypertension Continue current medications.  Refilling amlodipine. - amLODipine (NORVASC) 10 MG tablet; Take 1 tablet (10 mg total) by mouth daily.  Dispense: 90 tablet; Refill: 1  2. Degenerative disc disease, cervical 3. Lumbar degenerative disc disease 4. Primary osteoarthritis of both hands 5. Myalgia Discussed the recommendation to avoid chronic narcotics as long as possible.  He is already on pregabalin as well as Celebrex.  Consider adding a muscle relaxer or topical analgesics.  After discussion, feel that he would benefit greatly from a referral to pain management for a discussion regarding comprehensive pain management options. - Ambulatory referral to Pain Clinic  6. Cough  syncope syndrome Describes symptoms during his episode of cough syncope is a bit concerning for potential seizure-like activity.  Unfortunately he has no other symptoms during other times so it would be difficult to evaluate and manage unless an episode is witnessed.  Recommend using over-the-counter cough suppressants on  an as-needed basis for mild to moderate coughing.  For severe coughing I am agreeable to sending Promethazine DM 5 mL 4 times daily as needed.  Advised using this only when absolutely necessary and no other medications seem to help.  Offered neurology referral for further investigation however patient declined at this time stating that he will let me know if he wants to go this route should his symptoms become more frequent or severe.  I discussed the assessment and treatment plan with the patient. The patient was provided an opportunity to ask questions and all were answered. The patient agreed with the plan and demonstrated an understanding of the instructions.   The patient was advised to call back or seek an in-person evaluation if the symptoms worsen or if the condition fails to improve as anticipated.  25 minutes of non-face-to-face time was provided during this encounter.  No follow-ups on file.  Clearnce Sorrel, DNP, APRN, FNP-BC [place] [facility] Sports Medicine

## 2023-01-08 ENCOUNTER — Ambulatory Visit (HOSPITAL_BASED_OUTPATIENT_CLINIC_OR_DEPARTMENT_OTHER): Payer: BC Managed Care – PPO | Attending: Medical-Surgical | Admitting: Internal Medicine

## 2023-01-08 DIAGNOSIS — R0683 Snoring: Secondary | ICD-10-CM | POA: Diagnosis not present

## 2023-01-08 DIAGNOSIS — G4733 Obstructive sleep apnea (adult) (pediatric): Secondary | ICD-10-CM | POA: Insufficient documentation

## 2023-01-08 DIAGNOSIS — G479 Sleep disorder, unspecified: Secondary | ICD-10-CM

## 2023-01-11 DIAGNOSIS — G479 Sleep disorder, unspecified: Secondary | ICD-10-CM

## 2023-01-11 NOTE — Procedures (Signed)
Patient Name: Wesley Duran, Wesley Duran Date: 01/08/2023 Gender: Male D.O.B: May 03, 1971 Age (years): 68 Referring Provider: Samuel Bouche NP Height (inches): 80 Interpreting Physician: Baird Lyons MD, ABSM Weight (lbs): 248 RPSGT: Zadie Rhine BMI: 38 MRN: [id number] Neck Size: 18.00  CLINICAL INFORMATION Sleep Study Type: Split Night CPAP Indication for sleep study: Obesity, OSA, Snoring Epworth Sleepiness Score: 12  SLEEP STUDY TECHNIQUE As per the AASM Manual for the Scoring of Sleep and Associated Events v2.3 (April 2016) with a hypopnea requiring 4% desaturations.  The channels recorded and monitored were frontal, central and occipital EEG, electrooculogram (EOG), submentalis EMG (chin), nasal and oral airflow, thoracic and abdominal wall motion, anterior tibialis EMG, snore microphone, electrocardiogram, and pulse oximetry. Continuous positive airway pressure (CPAP) was initiated when the patient met split night criteria and was titrated according to treat sleep-disordered breathing.  MEDICATIONS Medications self-administered by patient taken the night of the study : CELEBREX, Lyrica, TRAZODONE  RESPIRATORY PARAMETERS Diagnostic  Total AHI (/hr): 64.6 RDI (/hr): 69.3 OA Index (/hr): 2.4 CA Index (/hr): 0.0 REM AHI (/hr): N/A NREM AHI (/hr): 64.6 Supine AHI (/hr): 86.3 Non-supine AHI (/hr): 56.5 Min O2 Sat (%): 79.0 Mean O2 (%): 87.6 Time below 88% (min): 99.7   Titration  Optimal Pressure (cm): 13 AHI at Optimal Pressure (/hr): 0 Min O2 at Optimal Pressure (%): 87.0 Supine % at Optimal (%): 100 Sleep % at Optimal (%): 97   SLEEP ARCHITECTURE The recording time for the entire night was 375.3 minutes.  During a baseline period of 171.9 minutes, the patient slept for 151.5 minutes in REM and nonREM, yielding a sleep efficiency of 88.1%. Sleep onset after lights out was 10.0 minutes with a REM latency of N/A minutes. The patient spent 5.0% of the night in stage N1 sleep, 95.0%  in stage N2 sleep, 0.0% in stage N3 and 0% in REM.  During the titration period of 201.1 minutes, the patient slept for 191.5 minutes in REM and nonREM, yielding a sleep efficiency of 95.2%. Sleep onset after CPAP initiation was 6.8 minutes with a REM latency of 6.5 minutes. The patient spent 2.1% of the night in stage N1 sleep, 42.3% in stage N2 sleep, 0.3% in stage N3 and 55.4% in REM.  CARDIAC DATA The 2 lead EKG demonstrated sinus rhythm. The mean heart rate was 100.0 beats per minute. Other EKG findings include: None.  LEG MOVEMENT DATA The total Periodic Limb Movements of Sleep (PLMS) were 0. The PLMS index was 0.0 .  IMPRESSIONS - Severe obstructive sleep apnea occurred during the diagnostic portion of the study (AHI = 64.6/hour). An optimal PAP pressure was selected for this patient ( 13 cm of water) - No significant central sleep apnea occurred during the diagnostic portion of the study (CAI = 0.0/hour). - Severe oxygen desaturation was noted during the diagnostic portion of the study (Min O2 = 79.0%). On CPAP 13, minimum O2 saturation 87%, mean 89.2%, indicating Nocturnal Hypoxemia. - Loud snoring was audible during the diagnostic portion of the study. - No cardiac abnormalities were noted during this study. - Clinically significant periodic limb movements did not occur during sleep.  DIAGNOSIS - Obstructive Sleep Apnea (G47.33)  RECOMMENDATIONS - Trial of CPAP therapy on 13 cm H2O or autopap 5-15. - Patient used a Medium size Resmed Full Face Mirage Quattro mask and heated humidification. - Recommend supplemental O2 2L through CPAP during sleep for Nocturnal Hyupoxemia not corrected by CPAP. - Be careful with alcohol, sedatives and other  CNS depressants that may worsen sleep apnea and disrupt normal sleep architecture. - Sleep hygiene should be reviewed to assess factors that may improve sleep quality. - Weight management and regular exercise should be initiated or  continued.  [Electronically signed] 01/11/2023 12:25 PM  Baird Lyons MD, [place], American Board of Sleep Medicine NPI: [id number]                         [place], [place] [facility]  ELECTRONICALLY SIGNED ON:  01/11/2023, 12:20 PM [place] PH: [phone]   FX: [phone] [place]

## 2023-01-12 ENCOUNTER — Other Ambulatory Visit: Payer: Self-pay | Admitting: Medical-Surgical

## 2023-01-12 ENCOUNTER — Encounter: Payer: Self-pay | Admitting: Medical-Surgical

## 2023-01-12 MED ORDER — AMBULATORY NON FORMULARY MEDICATION: 0 refills | Status: DC

## 2023-01-14 ENCOUNTER — Encounter: Payer: Self-pay | Admitting: Medical-Surgical

## 2023-01-22 ENCOUNTER — Other Ambulatory Visit: Payer: Self-pay | Admitting: Sports Medicine

## 2023-01-22 DIAGNOSIS — M5412 Radiculopathy, cervical region: Secondary | ICD-10-CM

## 2023-01-26 ENCOUNTER — Encounter: Payer: Self-pay | Admitting: Sports Medicine

## 2023-01-26 ENCOUNTER — Encounter: Payer: Self-pay | Admitting: Medical-Surgical

## 2023-01-26 DIAGNOSIS — M5412 Radiculopathy, cervical region: Secondary | ICD-10-CM

## 2023-01-26 MED ORDER — PREGABALIN 300 MG PO CAPS: 300.0000 mg | ORAL_CAPSULE | Freq: Two times a day (BID) | ORAL | 3 refills | Status: DC

## 2023-01-26 NOTE — Telephone Encounter (Signed)
I have re-faxed to [place] at [phone]

## 2023-01-29 ENCOUNTER — Other Ambulatory Visit: Payer: Self-pay

## 2023-01-29 MED ORDER — AMBULATORY NON FORMULARY MEDICATION: 0 refills | Status: AC

## 2023-02-04 ENCOUNTER — Encounter: Payer: Self-pay | Admitting: Medical-Surgical

## 2023-02-04 DIAGNOSIS — L989 Disorder of the skin and subcutaneous tissue, unspecified: Secondary | ICD-10-CM

## 2023-02-04 DIAGNOSIS — I10 Essential (primary) hypertension: Secondary | ICD-10-CM

## 2023-02-04 MED ORDER — EZETIMIBE 10 MG PO TABS: 10.0000 mg | ORAL_TABLET | Freq: Every day | ORAL | 1 refills | Status: DC

## 2023-02-04 MED ORDER — TRAZODONE HCL 100 MG PO TABS: 100.0000 mg | ORAL_TABLET | Freq: Every day | ORAL | 1 refills | Status: DC

## 2023-02-04 MED ORDER — CELECOXIB 200 MG PO CAPS: 200.0000 mg | ORAL_CAPSULE | Freq: Two times a day (BID) | ORAL | 1 refills | Status: DC

## 2023-02-04 MED ORDER — VALSARTAN-HYDROCHLOROTHIAZIDE 160-25 MG PO TABS: 1.0000 | ORAL_TABLET | Freq: Every day | ORAL | 1 refills | Status: DC

## 2023-02-04 MED ORDER — METOPROLOL SUCCINATE ER 100 MG PO TB24: 100.0000 mg | ORAL_TABLET | Freq: Every day | ORAL | 1 refills | Status: DC

## 2023-02-04 NOTE — Telephone Encounter (Signed)
Pain Management referral is still under review  by Dr. Holley Raring. Provider has to approved before scheduling team can schedule.

## 2023-02-05 MED ORDER — TRAZODONE HCL 100 MG PO TABS: 100.0000 mg | ORAL_TABLET | Freq: Every day | ORAL | 0 refills | Status: DC

## 2023-02-05 NOTE — Addendum Note (Signed)
Addended by: Narda Rutherford on: 02/05/2023 11:36 AM   Modules accepted: Orders

## 2023-02-13 ENCOUNTER — Encounter: Payer: Self-pay | Admitting: Medical-Surgical

## 2023-03-11 DIAGNOSIS — G4733 Obstructive sleep apnea (adult) (pediatric): Secondary | ICD-10-CM | POA: Diagnosis not present

## 2023-03-30 ENCOUNTER — Encounter: Payer: Self-pay | Admitting: Medical-Surgical

## 2023-03-30 ENCOUNTER — Ambulatory Visit (INDEPENDENT_AMBULATORY_CARE_PROVIDER_SITE_OTHER): Payer: BC Managed Care – PPO | Admitting: Medical-Surgical

## 2023-03-30 VITALS — BP 93/60 | HR 83 | Resp 20 | Ht 68.0 in | Wt 248.9 lb

## 2023-03-30 DIAGNOSIS — J40 Bronchitis, not specified as acute or chronic: Secondary | ICD-10-CM

## 2023-03-30 DIAGNOSIS — J329 Chronic sinusitis, unspecified: Secondary | ICD-10-CM | POA: Diagnosis not present

## 2023-03-30 MED ORDER — PROMETHAZINE-DM 6.25-15 MG/5ML PO SYRP: 5.0000 mL | ORAL_SOLUTION | Freq: Four times a day (QID) | ORAL | 0 refills | Status: DC | PRN

## 2023-03-30 MED ORDER — AMOXICILLIN-POT CLAVULANATE 875-125 MG PO TABS: 1.0000 | ORAL_TABLET | Freq: Two times a day (BID) | ORAL | 0 refills | Status: DC

## 2023-03-30 MED ORDER — ALBUTEROL SULFATE HFA 108 (90 BASE) MCG/ACT IN AERS: 2.0000 | INHALATION_SPRAY | Freq: Four times a day (QID) | RESPIRATORY_TRACT | 11 refills | Status: AC | PRN

## 2023-03-30 MED ORDER — ALBUTEROL SULFATE HFA 108 (90 BASE) MCG/ACT IN AERS: 2.0000 | INHALATION_SPRAY | Freq: Four times a day (QID) | RESPIRATORY_TRACT | 11 refills | Status: DC | PRN

## 2023-03-30 NOTE — Progress Notes (Signed)
        Established patient visit  History, exam, impression, and plan:  1. Sinobronchitis Approximately 1 week of symptoms starting with sinus congestion, chest congestion, headache, and cough productive of thick creamy sputum.  Has also developed significant nausea as well as diarrhea over the last 2 days.  Unable to eat and drink normally and is having difficulty with cough syncope symptoms.  Has tried over-the-counter cough syrup which seems to be helping temporarily with his cough.  Notes that the cough and sputum production is worse at night as well as first thing in the morning.  Discussed conservative measures for management of symptoms.  Recommend starting with clear liquids, advance to a bland diet as tolerated.  Sending Zofran for nausea.  Start Promethazine DM 4 times daily as needed for cough/nausea.  With 1 week of symptoms, severe sleep apnea, and suspected COPD treating with Augmentin twice daily x 7 days.  Discussed use of prednisone.  He would like to hold off on this for couple of days and if no improvement, he will let me know so I can send it in.  Procedures performed this visit: None.  Return if symptoms worsen or fail to improve.  __________________________________ Clearnce Sorrel, DNP, APRN, FNP-BC Primary Care and [place]

## 2023-04-08 ENCOUNTER — Telehealth: Payer: Self-pay | Admitting: Medical-Surgical

## 2023-04-08 ENCOUNTER — Other Ambulatory Visit: Payer: Self-pay | Admitting: Family Medicine

## 2023-04-08 DIAGNOSIS — R7303 Prediabetes: Secondary | ICD-10-CM

## 2023-04-08 DIAGNOSIS — I10 Essential (primary) hypertension: Secondary | ICD-10-CM

## 2023-04-08 DIAGNOSIS — E1140 Type 2 diabetes mellitus with diabetic neuropathy, unspecified: Secondary | ICD-10-CM

## 2023-04-08 DIAGNOSIS — E7849 Other hyperlipidemia: Secondary | ICD-10-CM

## 2023-04-08 NOTE — Telephone Encounter (Signed)
Please contact patient regarding the below message.  Refill of Mounjaro was sent to the pharmacy by Dr. Tamera Punt.  On review, it looks like he is overdue for some basic labs and to have his hemoglobin A1c rechecked.  I have placed these lab orders today since he was just recently in office.  He is welcome to go to any [facility] to have the lab work done.  Once those results are back, I will reach out with any recommendations. ___________________________________________ Thayer Ohm, DNP, APRN, FNP-BC Primary Care and Sports Medicine [facility] [place] [place]

## 2023-04-09 ENCOUNTER — Encounter: Payer: Self-pay | Admitting: Medical-Surgical

## 2023-04-09 MED ORDER — TIRZEPATIDE 5 MG/0.5ML ~~LOC~~ SOAJ: 5.0000 mg | SUBCUTANEOUS | 0 refills | Status: DC

## 2023-04-09 MED ORDER — TIRZEPATIDE 2.5 MG/0.5ML ~~LOC~~ SOAJ: 2.5000 mg | SUBCUTANEOUS | 0 refills | Status: DC

## 2023-04-09 NOTE — Addendum Note (Signed)
Addended byChristen Butter on: 04/09/2023 02:32 PM   Modules accepted: Orders

## 2023-04-09 NOTE — Telephone Encounter (Signed)
Patient advised.

## 2023-04-11 DIAGNOSIS — G4733 Obstructive sleep apnea (adult) (pediatric): Secondary | ICD-10-CM | POA: Diagnosis not present

## 2023-04-23 DIAGNOSIS — R7303 Prediabetes: Secondary | ICD-10-CM | POA: Diagnosis not present

## 2023-04-23 DIAGNOSIS — I10 Essential (primary) hypertension: Secondary | ICD-10-CM | POA: Diagnosis not present

## 2023-04-23 DIAGNOSIS — E7849 Other hyperlipidemia: Secondary | ICD-10-CM | POA: Diagnosis not present

## 2023-04-24 LAB — COMPLETE METABOLIC PANEL WITH GFR
AG Ratio: 1.7 (calc) (ref 1.0–2.5)
ALT: 24 U/L (ref 9–46)
AST: 15 U/L (ref 10–35)
Albumin: 4.2 g/dL (ref 3.6–5.1)
Alkaline phosphatase (APISO): 61 U/L (ref 35–144)
BUN: 20 mg/dL (ref 7–25)
CO2: 26 mmol/L (ref 20–32)
Calcium: 9.6 mg/dL (ref 8.6–10.3)
Chloride: 104 mmol/L (ref 98–110)
Creat: 1.05 mg/dL (ref 0.70–1.30)
Globulin: 2.5 g/dL (calc) (ref 1.9–3.7)
Glucose, Bld: 82 mg/dL (ref 65–99)
Potassium: 4.4 mmol/L (ref 3.5–5.3)
Sodium: 138 mmol/L (ref 135–146)
Total Bilirubin: 0.4 mg/dL (ref 0.2–1.2)
Total Protein: 6.7 g/dL (ref 6.1–8.1)
eGFR: 86 mL/min/{1.73_m2} (ref >=60)

## 2023-04-24 LAB — CBC WITH DIFFERENTIAL/PLATELET
Absolute Monocytes: 789 cells/uL (ref 200–950)
Basophils Absolute: 42 cells/uL (ref 0–200)
Basophils Relative: 0.5 %
Eosinophils Absolute: 332 cells/uL (ref 15–500)
Eosinophils Relative: 4 %
HCT: 43.9 % (ref 38.5–50.0)
Hemoglobin: 15.1 g/dL (ref 13.2–17.1)
Lymphs Abs: 3312 cells/uL (ref 850–3900)
MCH: 30.3 pg (ref 27.0–33.0)
MCHC: 34.4 g/dL (ref 32.0–36.0)
MCV: 88.2 fL (ref 80.0–100.0)
MPV: 10 fL (ref 7.5–12.5)
Monocytes Relative: 9.5 %
Neutro Abs: 3826 cells/uL (ref 1500–7800)
Neutrophils Relative %: 46.1 %
Platelets: 234 10*3/uL (ref 140–400)
RBC: 4.98 10*6/uL (ref 4.20–5.80)
RDW: 13 % (ref 11.0–15.0)
Total Lymphocyte: 39.9 %
WBC: 8.3 10*3/uL (ref 3.8–10.8)

## 2023-04-24 LAB — HEMOGLOBIN A1C
Hgb A1c MFr Bld: 6.5 % of total Hgb — ABNORMAL HIGH (ref <5.7)
Mean Plasma Glucose: 140 mg/dL
eAG (mmol/L): 7.7 mmol/L

## 2023-04-24 LAB — LIPID PANEL
Cholesterol: 107 mg/dL (ref <200)
HDL: 37 mg/dL — ABNORMAL LOW (ref >=40)
LDL Cholesterol (Calc): 40 mg/dL (calc)
Non-HDL Cholesterol (Calc): 70 mg/dL (calc) (ref <130)
Total CHOL/HDL Ratio: 2.9 (calc) (ref <5.0)
Triglycerides: 238 mg/dL — ABNORMAL HIGH (ref <150)

## 2023-04-24 MED ORDER — BLOOD GLUCOSE TEST VI STRP: 1.0000 | ORAL_STRIP | Freq: Two times a day (BID) | 11 refills | Status: AC | PRN

## 2023-04-24 MED ORDER — LANCETS MISC. MISC: 1.0000 | Freq: Two times a day (BID) | 11 refills | Status: AC | PRN

## 2023-04-24 MED ORDER — LANCET DEVICE MISC: 1.0000 | Freq: Two times a day (BID) | 0 refills | Status: AC | PRN

## 2023-04-24 MED ORDER — BLOOD GLUCOSE MONITORING SUPPL DEVI: 1.0000 | Freq: Two times a day (BID) | 0 refills | Status: AC | PRN

## 2023-04-24 NOTE — Addendum Note (Signed)
Addended byChristen Butter on: 04/24/2023 07:29 AM   Modules accepted: Orders

## 2023-04-27 ENCOUNTER — Other Ambulatory Visit: Payer: Self-pay | Admitting: Medical-Surgical

## 2023-04-28 ENCOUNTER — Ambulatory Visit (INDEPENDENT_AMBULATORY_CARE_PROVIDER_SITE_OTHER): Payer: BC Managed Care – PPO | Admitting: Sports Medicine

## 2023-04-28 DIAGNOSIS — M5136 Other intervertebral disc degeneration, lumbar region: Secondary | ICD-10-CM

## 2023-04-28 DIAGNOSIS — R29818 Other symptoms and signs involving the nervous system: Secondary | ICD-10-CM | POA: Diagnosis not present

## 2023-04-28 DIAGNOSIS — M5030 Other cervical disc degeneration, unspecified cervical region: Secondary | ICD-10-CM | POA: Diagnosis not present

## 2023-04-28 DIAGNOSIS — M51369 Other intervertebral disc degeneration, lumbar region without mention of lumbar back pain or lower extremity pain: Secondary | ICD-10-CM

## 2023-04-28 MED ORDER — TRAMADOL HCL 50 MG PO TABS
50.0000 mg | ORAL_TABLET | Freq: Three times a day (TID) | ORAL | 0 refills | Status: DC | PRN
Start: 2023-04-28 — End: 2023-05-12

## 2023-04-28 MED ORDER — CYCLOBENZAPRINE HCL 10 MG PO TABS
ORAL_TABLET | ORAL | 0 refills | Status: DC
Start: 2023-04-28 — End: 2023-05-12

## 2023-04-28 NOTE — Assessment & Plan Note (Addendum)
Wesley Duran also has multilevel lumbar degenerative disc disease, he has a very large disc protrusions from L4-S1 with central and foraminal stenosis. He has lumbar radiculopathy in the left leg in an L4 distribution. He also has significant axial low back pain, he had a short period of relief after a left L4-L5 interlaminar epidural. I did review his lumbar spine MRI again, he has a large central L5-S1 disc protrusion and a left-sided foraminal disc protrusion at L4-L5. We did discuss physical therapy today, though he tells me he cannot afford it for now. I think is reasonable to proceed with left L4-L5 and left L5-S1 transforaminal epidurals this time. Tramadol as above, continue Lyrica. Adding some Flexeril for evening use. Return to see me 4 to 6 weeks after the epidurals.

## 2023-04-28 NOTE — Assessment & Plan Note (Addendum)
This is a 52 year old male, he returns regarding cervical degenerative disc disease with left-sided cervical radiculopathy. We last saw him in September 2023, he had C5-C7 degenerative disc disease on MRI with multiple areas of foraminal stenosis. We increased his Lyrica to 300 twice daily and ordered a cervical epidural, this seemed to work for a couple of months. He is back with recurrence of pain. We will order another cervical epidural, and I did explain to him he could have 3 epidurals within a 24-month period of time. I would also like a consultation just to get him plugged in with neurosurgery downstairs. We did discuss physical therapy today, though he tells me he cannot afford it for now. Adding tramadol for pain relief in the meantime. Return to see me about 4 to 6 weeks after his epidural for discussion of the next steps.

## 2023-04-28 NOTE — Progress Notes (Signed)
    Procedures performed today:    None.  Independent interpretation of notes and tests performed by another provider:   None.  Brief History, Exam, Impression, and Recommendations:    Degenerative disc disease, cervical This is a 52 year old male, he returns regarding cervical degenerative disc disease with left-sided cervical radiculopathy. We last saw him in September 2023, he had C5-C7 degenerative disc disease on MRI with multiple areas of foraminal stenosis. We increased his Lyrica to 300 twice daily and ordered a cervical epidural, this seemed to work for a couple of months. He is back with recurrence of pain. We will order another cervical epidural, and I did explain to him he could have 3 epidurals within a 55-month period of time. I would also like a consultation just to get him plugged in with neurosurgery downstairs. We did discuss physical therapy today, though he tells me he cannot afford it for now. Adding tramadol for pain relief in the meantime. Return to see me about 4 to 6 weeks after his epidural for discussion of the next steps.  Lumbar degenerative disc disease Wesley Duran also has multilevel lumbar degenerative disc disease, he has a very large disc protrusions from L4-S1 with central and foraminal stenosis. He has lumbar radiculopathy in the left leg in an L4 distribution. He also has significant axial low back pain, he had a short period of relief after a left L4-L5 interlaminar epidural. I did review his lumbar spine MRI again, he has a large central L5-S1 disc protrusion and a left-sided foraminal disc protrusion at L4-L5. We did discuss physical therapy today, though he tells me he cannot afford it for now. I think is reasonable to proceed with left L4-L5 and left L5-S1 transforaminal epidurals this time. Tramadol as above, continue Lyrica. Adding some Flexeril for evening use. Return to see me 4 to 6 weeks after the epidurals.  Disability due to neurological  disorder Wesley Duran is going through SSI disability proceedings currently, we will maximize medical management, but I don't think he has reached maximal medical improvement yet.  I would suggest we do at least 3 epidurals within a 98-month period of time as well as maximizing the medications prescribed today as well as a consultation with neurosurgery before we consider him having reached MMI.    ____________________________________________ Wesley Duran. Benjamin Stain, M.D., ABFM., CAQSM., AME. Primary Care and Sports Medicine [facility]  Adjunct Professor of Family Medicine  [employer]  Restaurant manager, fast food

## 2023-04-28 NOTE — Assessment & Plan Note (Signed)
Wesley Duran is going through SSI disability proceedings currently, we will maximize medical management, but I don't think he has reached maximal medical improvement yet.  I would suggest we do at least 3 epidurals within a 76-month period of time as well as maximizing the medications prescribed today as well as a consultation with neurosurgery before we consider him having reached MMI.

## 2023-04-30 ENCOUNTER — Encounter: Payer: Self-pay | Admitting: Medical-Surgical

## 2023-04-30 ENCOUNTER — Other Ambulatory Visit: Payer: Self-pay | Admitting: Medical-Surgical

## 2023-04-30 DIAGNOSIS — I10 Essential (primary) hypertension: Secondary | ICD-10-CM

## 2023-05-04 ENCOUNTER — Encounter: Payer: Self-pay | Admitting: Sports Medicine

## 2023-05-04 ENCOUNTER — Encounter: Payer: Self-pay | Admitting: Cardiology

## 2023-05-04 MED ORDER — EZETIMIBE 10 MG PO TABS: 10.0000 mg | ORAL_TABLET | Freq: Every day | ORAL | 1 refills | Status: DC

## 2023-05-04 MED ORDER — METOPROLOL SUCCINATE ER 100 MG PO TB24
100.0000 mg | ORAL_TABLET | Freq: Every day | ORAL | 1 refills | Status: DC
Start: 2023-05-04 — End: 2023-11-18

## 2023-05-04 MED ORDER — CELECOXIB 200 MG PO CAPS: 200.0000 mg | ORAL_CAPSULE | Freq: Two times a day (BID) | ORAL | 1 refills | Status: DC

## 2023-05-04 MED ORDER — VALSARTAN-HYDROCHLOROTHIAZIDE 160-25 MG PO TABS
1.0000 | ORAL_TABLET | Freq: Every day | ORAL | 1 refills | Status: DC
Start: 2023-05-04 — End: 2023-06-23

## 2023-05-11 ENCOUNTER — Telehealth (INDEPENDENT_AMBULATORY_CARE_PROVIDER_SITE_OTHER): Payer: BC Managed Care – PPO | Admitting: Medical-Surgical

## 2023-05-11 ENCOUNTER — Encounter: Payer: Self-pay | Admitting: Medical-Surgical

## 2023-05-11 DIAGNOSIS — G4733 Obstructive sleep apnea (adult) (pediatric): Secondary | ICD-10-CM | POA: Diagnosis not present

## 2023-05-11 DIAGNOSIS — G629 Polyneuropathy, unspecified: Secondary | ICD-10-CM | POA: Insufficient documentation

## 2023-05-11 NOTE — Progress Notes (Signed)
Virtual Visit via Video Note  I connected with Wesley Duran on 05/11/23 at 11:10 AM EDT by a video enabled telemedicine application and verified that I am speaking with the correct person using two identifiers.   I discussed the limitations of evaluation and management by telemedicine and the availability of in person appointments. The patient expressed understanding and agreed to proceed.  Patient location: home Provider locations: office  Subjective:    CC: CPAP follow up  HPI: Pleasant 52 year old male presenting today via MyChart video visit to follow up on CPAP use. He has been using a CPAP for severe OSA for approximately 2.5 months. It took a couple of weeks to get used to it but he reports he is wearing it every night and able to wear it the entire night. Using the app with reports of scores of 96-100 each night. Does not nap through the day. Feels that his daytime fatigue is greatly improved. BP has been stable although has decreased a bit. He is working with his cardiologist on medication management.   Past medical history, Surgical history, Family history not pertinant except as noted below, Social history, Allergies, and medications have been entered into the medical record, reviewed, and corrections made.   Review of Systems: See HPI for pertinent positives and negatives.   Objective:    General: Speaking clearly in complete sentences without any shortness of breath.  Alert and oriented x3.  Normal judgment. No apparent acute distress.  Impression and Recommendations:    1. OSA on CPAP Doing well on his CPAP with good benefit to date. Requesting compliance report from [employer]. Continue CPAP use at bedtime and during any daytime naps.   I discussed the assessment and treatment plan with the patient. The patient was provided an opportunity to ask questions and all were answered. The patient agreed with the plan and demonstrated an understanding of the instructions.   The  patient was advised to call back or seek an in-person evaluation if the symptoms worsen or if the condition fails to improve as anticipated.  20 minutes of non-face-to-face time was provided during this encounter.  Return in about 6 months (around 11/11/2023) for chronic disease follow up.  Thayer Ohm, DNP, APRN, FNP-BC [facility] and Sports Medicine

## 2023-05-12 ENCOUNTER — Ambulatory Visit (INDEPENDENT_AMBULATORY_CARE_PROVIDER_SITE_OTHER): Payer: BC Managed Care – PPO | Admitting: Sports Medicine

## 2023-05-12 DIAGNOSIS — M5030 Other cervical disc degeneration, unspecified cervical region: Secondary | ICD-10-CM

## 2023-05-12 DIAGNOSIS — M5136 Other intervertebral disc degeneration, lumbar region: Secondary | ICD-10-CM | POA: Diagnosis not present

## 2023-05-12 DIAGNOSIS — M51369 Other intervertebral disc degeneration, lumbar region without mention of lumbar back pain or lower extremity pain: Secondary | ICD-10-CM

## 2023-05-12 MED ORDER — CYCLOBENZAPRINE HCL 10 MG PO TABS: 10.0000 mg | ORAL_TABLET | Freq: Two times a day (BID) | ORAL | 3 refills | Status: DC

## 2023-05-12 MED ORDER — TRAMADOL HCL 50 MG PO TABS
100.0000 mg | ORAL_TABLET | Freq: Two times a day (BID) | ORAL | 3 refills | Status: AC
Start: 2023-05-12 — End: ?

## 2023-05-12 NOTE — Assessment & Plan Note (Signed)
52 year old male known cervical DDD, C5-C7 changes on MRI with multiple areas of foraminal stenosis, Lyrica was increased to 300 twice daily, his initial epidural many months ago worked well, he has not yet had his second epidural. I like to see him back at least a month after the epidural, refilling tramadol in the meantime. He also has not yet touched base with neurosurgery.

## 2023-05-12 NOTE — Progress Notes (Signed)
    Procedures performed today:    None.  Independent interpretation of notes and tests performed by another provider:   None.  Brief History, Exam, Impression, and Recommendations:    Degenerative disc disease, cervical 52 year old male known cervical DDD, C5-C7 changes on MRI with multiple areas of foraminal stenosis, Lyrica was increased to 300 twice daily, his initial epidural many months ago worked well, he has not yet had his second epidural. I like to see him back at least a month after the epidural, refilling tramadol in the meantime. He also has not yet touched base with neurosurgery.  Lumbar degenerative disc disease Also known lumbar DDD, very large protrusions L4-S1 with central and foraminal stenosis, he had a left L4-L5 interlaminar epidural in the past that seem to work okay, we ordered a left L4-L5 and left L5-S1 transforaminal epidural but he has not yet had these done, refilling tramadol, Flexeril, continue Lyrica, return to see me at least 4 to 6 weeks after epidurals, he also has a neurosurgery appointment coming up.    ____________________________________________ Ihor Austin. Benjamin Stain, M.D., ABFM., CAQSM., AME. Primary Care and Sports Medicine [facility]  Adjunct Professor of Family Medicine  [employer]  Restaurant manager, fast food

## 2023-05-12 NOTE — Assessment & Plan Note (Signed)
Also known lumbar DDD, very large protrusions L4-S1 with central and foraminal stenosis, he had a left L4-L5 interlaminar epidural in the past that seem to work okay, we ordered a left L4-L5 and left L5-S1 transforaminal epidural but he has not yet had these done, refilling tramadol, Flexeril, continue Lyrica, return to see me at least 4 to 6 weeks after epidurals, he also has a neurosurgery appointment coming up.

## 2023-05-22 ENCOUNTER — Ambulatory Visit
Admission: RE | Admit: 2023-05-22 | Discharge: 2023-05-22 | Disposition: A | Discharge status: Home / Self Care | Payer: BC Managed Care – PPO | Source: Ambulatory Visit | Attending: Sports Medicine | Admitting: Sports Medicine

## 2023-05-22 DIAGNOSIS — M5030 Other cervical disc degeneration, unspecified cervical region: Secondary | ICD-10-CM

## 2023-05-22 DIAGNOSIS — M4722 Other spondylosis with radiculopathy, cervical region: Secondary | ICD-10-CM | POA: Diagnosis not present

## 2023-05-22 MED ORDER — IOPAMIDOL (ISOVUE-M 300) INJECTION 61%
1.0000 mL | Freq: Once | INTRAMUSCULAR | Status: AC
  Administered 2023-05-22: 1 mL via EPIDURAL

## 2023-05-22 MED ORDER — TRIAMCINOLONE ACETONIDE 40 MG/ML IJ SUSP (RADIOLOGY)
60.0000 mg | Freq: Once | INTRAMUSCULAR | Status: AC
  Administered 2023-05-22: 60 mg via EPIDURAL

## 2023-05-22 NOTE — Discharge Instructions (Signed)

## 2023-05-26 ENCOUNTER — Other Ambulatory Visit: Payer: Self-pay | Admitting: Sports Medicine

## 2023-05-26 DIAGNOSIS — M47812 Spondylosis without myelopathy or radiculopathy, cervical region: Secondary | ICD-10-CM | POA: Diagnosis not present

## 2023-05-26 DIAGNOSIS — Z6836 Body mass index (BMI) 36.0-36.9, adult: Secondary | ICD-10-CM | POA: Diagnosis not present

## 2023-05-26 DIAGNOSIS — M5126 Other intervertebral disc displacement, lumbar region: Secondary | ICD-10-CM | POA: Diagnosis not present

## 2023-05-26 DIAGNOSIS — M5412 Radiculopathy, cervical region: Secondary | ICD-10-CM

## 2023-05-29 ENCOUNTER — Encounter: Payer: Self-pay | Admitting: Sports Medicine

## 2023-05-29 DIAGNOSIS — E119 Type 2 diabetes mellitus without complications: Secondary | ICD-10-CM | POA: Diagnosis not present

## 2023-05-29 NOTE — Telephone Encounter (Signed)
I do not, and this is going to be an outlandish idea for him but has he thought about getting those notes....from the neurosurgeon? :-P

## 2023-06-03 ENCOUNTER — Encounter: Payer: Self-pay | Admitting: Medical-Surgical

## 2023-06-03 MED ORDER — TIRZEPATIDE 7.5 MG/0.5ML ~~LOC~~ SOAJ: 7.5000 mg | SUBCUTANEOUS | 0 refills | Status: DC

## 2023-06-05 ENCOUNTER — Ambulatory Visit
Admission: RE | Admit: 2023-06-05 | Discharge: 2023-06-05 | Disposition: A | Discharge status: Home / Self Care | Payer: BC Managed Care – PPO | Source: Ambulatory Visit | Attending: Sports Medicine | Admitting: Sports Medicine

## 2023-06-05 ENCOUNTER — Other Ambulatory Visit: Payer: Self-pay | Admitting: Sports Medicine

## 2023-06-05 DIAGNOSIS — M5416 Radiculopathy, lumbar region: Secondary | ICD-10-CM | POA: Diagnosis not present

## 2023-06-05 DIAGNOSIS — M5136 Other intervertebral disc degeneration, lumbar region: Secondary | ICD-10-CM

## 2023-06-05 MED ORDER — IOPAMIDOL (ISOVUE-M 200) INJECTION 41%
1.0000 mL | Freq: Once | INTRAMUSCULAR | Status: AC
  Administered 2023-06-05: 1 mL via EPIDURAL

## 2023-06-05 MED ORDER — METHYLPREDNISOLONE ACETATE 40 MG/ML INJ SUSP (RADIOLOG
80.0000 mg | Freq: Once | INTRAMUSCULAR | Status: AC
  Administered 2023-06-05: 80 mg via EPIDURAL

## 2023-06-05 NOTE — Discharge Instructions (Signed)

## 2023-06-09 ENCOUNTER — Ambulatory Visit: Payer: BC Managed Care – PPO | Admitting: Sports Medicine

## 2023-06-11 DIAGNOSIS — G4733 Obstructive sleep apnea (adult) (pediatric): Secondary | ICD-10-CM | POA: Diagnosis not present

## 2023-06-16 ENCOUNTER — Ambulatory Visit: Payer: BC Managed Care – PPO | Admitting: Dietician

## 2023-06-18 ENCOUNTER — Other Ambulatory Visit: Payer: Self-pay | Admitting: Neurosurgery

## 2023-06-18 DIAGNOSIS — M5126 Other intervertebral disc displacement, lumbar region: Secondary | ICD-10-CM

## 2023-06-18 DIAGNOSIS — M47812 Spondylosis without myelopathy or radiculopathy, cervical region: Secondary | ICD-10-CM

## 2023-06-22 ENCOUNTER — Encounter: Payer: Self-pay | Admitting: Cardiology

## 2023-06-26 ENCOUNTER — Other Ambulatory Visit: Payer: Self-pay | Admitting: Sports Medicine

## 2023-06-26 DIAGNOSIS — M5412 Radiculopathy, cervical region: Secondary | ICD-10-CM

## 2023-06-26 MED ORDER — PREGABALIN 300 MG PO CAPS
300.0000 mg | ORAL_CAPSULE | Freq: Two times a day (BID) | ORAL | 3 refills | Status: DC
Start: 2023-06-26 — End: 2023-12-24

## 2023-06-28 ENCOUNTER — Ambulatory Visit (INDEPENDENT_AMBULATORY_CARE_PROVIDER_SITE_OTHER): Payer: BC Managed Care – PPO

## 2023-06-28 DIAGNOSIS — M48061 Spinal stenosis, lumbar region without neurogenic claudication: Secondary | ICD-10-CM | POA: Diagnosis not present

## 2023-06-28 DIAGNOSIS — M4802 Spinal stenosis, cervical region: Secondary | ICD-10-CM | POA: Diagnosis not present

## 2023-06-28 DIAGNOSIS — M5127 Other intervertebral disc displacement, lumbosacral region: Secondary | ICD-10-CM | POA: Diagnosis not present

## 2023-06-28 DIAGNOSIS — M5126 Other intervertebral disc displacement, lumbar region: Secondary | ICD-10-CM

## 2023-06-28 DIAGNOSIS — M5136 Other intervertebral disc degeneration, lumbar region: Secondary | ICD-10-CM | POA: Diagnosis not present

## 2023-06-28 DIAGNOSIS — M50222 Other cervical disc displacement at C5-C6 level: Secondary | ICD-10-CM | POA: Diagnosis not present

## 2023-06-28 DIAGNOSIS — M50223 Other cervical disc displacement at C6-C7 level: Secondary | ICD-10-CM | POA: Diagnosis not present

## 2023-06-28 DIAGNOSIS — M47812 Spondylosis without myelopathy or radiculopathy, cervical region: Secondary | ICD-10-CM

## 2023-06-28 DIAGNOSIS — M4807 Spinal stenosis, lumbosacral region: Secondary | ICD-10-CM | POA: Diagnosis not present

## 2023-07-09 ENCOUNTER — Other Ambulatory Visit: Payer: Self-pay | Admitting: Medical-Surgical

## 2023-07-09 DIAGNOSIS — M47812 Spondylosis without myelopathy or radiculopathy, cervical region: Secondary | ICD-10-CM | POA: Diagnosis not present

## 2023-07-09 DIAGNOSIS — M5126 Other intervertebral disc displacement, lumbar region: Secondary | ICD-10-CM | POA: Diagnosis not present

## 2023-07-09 DIAGNOSIS — Z6834 Body mass index (BMI) 34.0-34.9, adult: Secondary | ICD-10-CM | POA: Diagnosis not present

## 2023-07-09 DIAGNOSIS — M4722 Other spondylosis with radiculopathy, cervical region: Secondary | ICD-10-CM | POA: Diagnosis not present

## 2023-07-11 DIAGNOSIS — G4733 Obstructive sleep apnea (adult) (pediatric): Secondary | ICD-10-CM | POA: Diagnosis not present

## 2023-07-13 ENCOUNTER — Other Ambulatory Visit: Payer: Self-pay | Admitting: Neurosurgery

## 2023-07-13 DIAGNOSIS — M5126 Other intervertebral disc displacement, lumbar region: Secondary | ICD-10-CM

## 2023-07-13 DIAGNOSIS — M47812 Spondylosis without myelopathy or radiculopathy, cervical region: Secondary | ICD-10-CM

## 2023-07-21 NOTE — Discharge Instructions (Signed)
Post Procedure Spinal Discharge Instruction Sheet  You may resume a regular diet and any medications that you routinely take (including pain medications) unless otherwise noted by MD.  No driving day of procedure.  Light activity throughout the rest of the day.  Do not do any strenuous work, exercise, bending or lifting.  The day following the procedure, you can resume normal physical activity but you should refrain from exercising or physical therapy for at least three days thereafter.  You may apply ice to the injection site, 20 minutes on, 20 minutes off, as needed. Do not apply ice directly to skin.    Common Side Effects:  Headaches- take your usual medications as directed by your physician.  Increase your fluid intake.  Caffeinated beverages may be helpful.  Lie flat in bed until your headache resolves.  Restlessness or inability to sleep- you may have trouble sleeping for the next few days.  Ask your referring physician if you need any medication for sleep.  Facial flushing or redness- should subside within a few days.  Increased pain- a temporary increase in pain a day or two following your procedure is not unusual.  Take your pain medication as prescribed by your referring physician.  Leg cramps  Please contact our office at [phone] for the following symptoms: Fever greater than 100 degrees. Headaches unresolved with medication after 2-3 days. Increased swelling, pain, or redness at injection site.  YOU MAY RESUME YOUR ASPIRIN TODAY, POST PROCEDURE.  Thank you for visiting [facility] today.

## 2023-07-22 ENCOUNTER — Ambulatory Visit
Admission: RE | Admit: 2023-07-22 | Discharge: 2023-07-22 | Disposition: A | Discharge status: Home / Self Care | Payer: BC Managed Care – PPO | Source: Ambulatory Visit | Attending: Neurosurgery | Admitting: Neurosurgery

## 2023-07-22 DIAGNOSIS — M47812 Spondylosis without myelopathy or radiculopathy, cervical region: Secondary | ICD-10-CM | POA: Diagnosis not present

## 2023-07-22 DIAGNOSIS — M5126 Other intervertebral disc displacement, lumbar region: Secondary | ICD-10-CM

## 2023-07-22 MED ORDER — TRIAMCINOLONE ACETONIDE 40 MG/ML IJ SUSP (RADIOLOGY)
60.0000 mg | Freq: Once | INTRAMUSCULAR | Status: AC
  Administered 2023-07-22: 60 mg via EPIDURAL

## 2023-07-22 MED ORDER — IOPAMIDOL (ISOVUE-M 300) INJECTION 61%
1.0000 mL | Freq: Once | INTRAMUSCULAR | Status: AC | PRN
  Administered 2023-07-22: 1 mL via EPIDURAL

## 2023-08-03 ENCOUNTER — Other Ambulatory Visit: Payer: Self-pay | Admitting: Medical-Surgical

## 2023-08-11 DIAGNOSIS — G4733 Obstructive sleep apnea (adult) (pediatric): Secondary | ICD-10-CM | POA: Diagnosis not present

## 2023-09-11 DIAGNOSIS — G4733 Obstructive sleep apnea (adult) (pediatric): Secondary | ICD-10-CM | POA: Diagnosis not present

## 2023-09-23 ENCOUNTER — Other Ambulatory Visit (HOSPITAL_BASED_OUTPATIENT_CLINIC_OR_DEPARTMENT_OTHER): Payer: Self-pay | Admitting: Family

## 2023-09-23 DIAGNOSIS — R600 Localized edema: Secondary | ICD-10-CM

## 2023-09-23 DIAGNOSIS — I10 Essential (primary) hypertension: Secondary | ICD-10-CM

## 2023-09-29 ENCOUNTER — Other Ambulatory Visit: Payer: Self-pay | Admitting: Sports Medicine

## 2023-09-29 DIAGNOSIS — M5030 Other cervical disc degeneration, unspecified cervical region: Secondary | ICD-10-CM

## 2023-10-01 ENCOUNTER — Ambulatory Visit: Payer: BC Managed Care – PPO | Admitting: Sports Medicine

## 2023-10-01 ENCOUNTER — Other Ambulatory Visit: Payer: Self-pay | Admitting: Family Medicine

## 2023-10-03 ENCOUNTER — Other Ambulatory Visit: Payer: Self-pay | Admitting: Medical-Surgical

## 2023-10-05 ENCOUNTER — Other Ambulatory Visit: Payer: Self-pay

## 2023-10-05 MED ORDER — MOUNJARO 7.5 MG/0.5ML ~~LOC~~ SOAJ: 7.5000 mg | SUBCUTANEOUS | 0 refills | Status: DC

## 2023-10-11 DIAGNOSIS — G4733 Obstructive sleep apnea (adult) (pediatric): Secondary | ICD-10-CM | POA: Diagnosis not present

## 2023-10-18 ENCOUNTER — Other Ambulatory Visit (HOSPITAL_BASED_OUTPATIENT_CLINIC_OR_DEPARTMENT_OTHER): Payer: Self-pay | Admitting: Family

## 2023-10-18 DIAGNOSIS — R600 Localized edema: Secondary | ICD-10-CM

## 2023-10-18 DIAGNOSIS — I10 Essential (primary) hypertension: Secondary | ICD-10-CM

## 2023-10-23 ENCOUNTER — Other Ambulatory Visit: Payer: Self-pay | Admitting: Neurosurgery

## 2023-10-23 DIAGNOSIS — M4722 Other spondylosis with radiculopathy, cervical region: Secondary | ICD-10-CM

## 2023-10-26 ENCOUNTER — Encounter (INDEPENDENT_AMBULATORY_CARE_PROVIDER_SITE_OTHER): Payer: BC Managed Care – PPO | Admitting: Sports Medicine

## 2023-10-26 DIAGNOSIS — M5030 Other cervical disc degeneration, unspecified cervical region: Secondary | ICD-10-CM | POA: Diagnosis not present

## 2023-10-27 MED ORDER — TRAMADOL HCL 50 MG PO TABS
100.0000 mg | ORAL_TABLET | Freq: Three times a day (TID) | ORAL | 0 refills | Status: DC
Start: 2023-10-27 — End: 2023-12-16

## 2023-10-27 NOTE — Telephone Encounter (Signed)

## 2023-10-28 ENCOUNTER — Other Ambulatory Visit: Payer: Self-pay | Admitting: Physician Assistant

## 2023-10-29 ENCOUNTER — Other Ambulatory Visit: Payer: BC Managed Care – PPO

## 2023-10-30 ENCOUNTER — Other Ambulatory Visit: Payer: Self-pay | Admitting: Medical-Surgical

## 2023-11-03 ENCOUNTER — Other Ambulatory Visit: Payer: Self-pay | Admitting: Medical-Surgical

## 2023-11-10 ENCOUNTER — Other Ambulatory Visit: Payer: Self-pay | Admitting: Medical-Surgical

## 2023-11-11 DIAGNOSIS — G4733 Obstructive sleep apnea (adult) (pediatric): Secondary | ICD-10-CM | POA: Diagnosis not present

## 2023-11-13 ENCOUNTER — Other Ambulatory Visit (HOSPITAL_BASED_OUTPATIENT_CLINIC_OR_DEPARTMENT_OTHER): Payer: Self-pay | Admitting: Internal Medicine

## 2023-11-13 ENCOUNTER — Telehealth: Payer: Self-pay | Admitting: Cardiology

## 2023-11-13 DIAGNOSIS — R600 Localized edema: Secondary | ICD-10-CM

## 2023-11-13 DIAGNOSIS — I10 Essential (primary) hypertension: Secondary | ICD-10-CM

## 2023-11-13 NOTE — Telephone Encounter (Signed)
Pt c/o medication issue:  1. Name of Medication: furosemide (LASIX) 20 MG tablet   2. How are you currently taking this medication (dosage and times per day)?    3. Are you having a reaction (difficulty breathing--STAT)? no  4. What is your medication issue? Calling to see how many times a day, patient is suppose to take medication. Please advise

## 2023-11-13 NOTE — Telephone Encounter (Signed)
Refused refill. Already filled

## 2023-11-16 ENCOUNTER — Other Ambulatory Visit: Payer: Self-pay | Admitting: Medical-Surgical

## 2023-11-16 DIAGNOSIS — I10 Essential (primary) hypertension: Secondary | ICD-10-CM

## 2023-11-20 NOTE — Discharge Instructions (Signed)
Post Procedure Spinal Discharge Instruction Sheet  You may resume a regular diet and any medications that you routinely take (including pain medications) unless otherwise noted by MD.  No driving day of procedure.  Light activity throughout the rest of the day.  Do not do any strenuous work, exercise, bending or lifting.  The day following the procedure, you can resume normal physical activity but you should refrain from exercising or physical therapy for at least three days thereafter.  You may apply ice to the injection site, 20 minutes on, 20 minutes off, as needed. Do not apply ice directly to skin.    Common Side Effects:  Headaches- take your usual medications as directed by your physician.  Increase your fluid intake.  Caffeinated beverages may be helpful.  Lie flat in bed until your headache resolves.  Restlessness or inability to sleep- you may have trouble sleeping for the next few days.  Ask your referring physician if you need any medication for sleep.  Facial flushing or redness- should subside within a few days.  Increased pain- a temporary increase in pain a day or two following your procedure is not unusual.  Take your pain medication as prescribed by your referring physician.  Leg cramps  Please contact our office at [phone] for the following symptoms: Fever greater than 100 degrees. Headaches unresolved with medication after 2-3 days. Increased swelling, pain, or redness at injection site.   Thank you for visiting [facility] today.   YOU MAY RESUME YOUR ASPIRIN ANYTIME AFTER PROCEDURE TODAY

## 2023-11-23 ENCOUNTER — Ambulatory Visit
Admission: RE | Admit: 2023-11-23 | Discharge: 2023-11-23 | Disposition: A | Discharge status: Home / Self Care | Payer: BC Managed Care – PPO | Source: Ambulatory Visit | Attending: Neurosurgery | Admitting: Neurosurgery

## 2023-11-23 DIAGNOSIS — M4722 Other spondylosis with radiculopathy, cervical region: Secondary | ICD-10-CM | POA: Diagnosis not present

## 2023-11-23 MED ORDER — IOPAMIDOL (ISOVUE-M 300) INJECTION 61%
1.0000 mL | Freq: Once | INTRAMUSCULAR | Status: AC
  Administered 2023-11-23: 1 mL via EPIDURAL

## 2023-11-23 MED ORDER — TRIAMCINOLONE ACETONIDE 40 MG/ML IJ SUSP (RADIOLOGY)
60.0000 mg | Freq: Once | INTRAMUSCULAR | Status: AC
  Administered 2023-11-23: 60 mg via EPIDURAL

## 2023-11-27 ENCOUNTER — Other Ambulatory Visit: Payer: Self-pay | Admitting: Medical-Surgical

## 2023-11-29 ENCOUNTER — Other Ambulatory Visit: Payer: Self-pay | Admitting: Medical-Surgical

## 2023-12-06 ENCOUNTER — Other Ambulatory Visit: Payer: Self-pay | Admitting: Sports Medicine

## 2023-12-06 DIAGNOSIS — M5030 Other cervical disc degeneration, unspecified cervical region: Secondary | ICD-10-CM

## 2023-12-08 ENCOUNTER — Other Ambulatory Visit: Payer: Self-pay | Admitting: Medical-Surgical

## 2023-12-08 ENCOUNTER — Other Ambulatory Visit: Payer: BC Managed Care – PPO

## 2023-12-09 NOTE — Discharge Instructions (Signed)

## 2023-12-10 ENCOUNTER — Other Ambulatory Visit: Payer: Self-pay | Admitting: Sports Medicine

## 2023-12-10 ENCOUNTER — Ambulatory Visit
Admission: RE | Admit: 2023-12-10 | Discharge: 2023-12-10 | Disposition: A | Discharge status: Home / Self Care | Payer: BC Managed Care – PPO | Source: Ambulatory Visit | Attending: Neurosurgery | Admitting: Neurosurgery

## 2023-12-10 DIAGNOSIS — M4727 Other spondylosis with radiculopathy, lumbosacral region: Secondary | ICD-10-CM | POA: Diagnosis not present

## 2023-12-10 DIAGNOSIS — M47812 Spondylosis without myelopathy or radiculopathy, cervical region: Secondary | ICD-10-CM

## 2023-12-10 DIAGNOSIS — M5126 Other intervertebral disc displacement, lumbar region: Secondary | ICD-10-CM

## 2023-12-10 DIAGNOSIS — M5030 Other cervical disc degeneration, unspecified cervical region: Secondary | ICD-10-CM

## 2023-12-10 DIAGNOSIS — M5116 Intervertebral disc disorders with radiculopathy, lumbar region: Secondary | ICD-10-CM | POA: Diagnosis not present

## 2023-12-10 MED ORDER — METHYLPREDNISOLONE ACETATE 40 MG/ML INJ SUSP (RADIOLOG
80.0000 mg | Freq: Once | INTRAMUSCULAR | Status: AC
  Administered 2023-12-10: 80 mg via EPIDURAL

## 2023-12-10 MED ORDER — IOPAMIDOL (ISOVUE-M 200) INJECTION 41%
1.0000 mL | Freq: Once | INTRAMUSCULAR | Status: AC
  Administered 2023-12-10: 1 mL via EPIDURAL

## 2023-12-11 DIAGNOSIS — G4733 Obstructive sleep apnea (adult) (pediatric): Secondary | ICD-10-CM | POA: Diagnosis not present

## 2023-12-14 ENCOUNTER — Other Ambulatory Visit: Payer: Self-pay | Admitting: Medical-Surgical

## 2023-12-14 DIAGNOSIS — I10 Essential (primary) hypertension: Secondary | ICD-10-CM

## 2023-12-15 ENCOUNTER — Ambulatory Visit (INDEPENDENT_AMBULATORY_CARE_PROVIDER_SITE_OTHER): Payer: BC Managed Care – PPO | Admitting: Medical-Surgical

## 2023-12-15 VITALS — BP 132/74 | HR 86 | Resp 20 | Ht 68.0 in | Wt 203.0 lb

## 2023-12-15 DIAGNOSIS — Z1211 Encounter for screening for malignant neoplasm of colon: Secondary | ICD-10-CM

## 2023-12-15 DIAGNOSIS — G720 Drug-induced myopathy: Secondary | ICD-10-CM

## 2023-12-15 DIAGNOSIS — I25118 Atherosclerotic heart disease of native coronary artery with other forms of angina pectoris: Secondary | ICD-10-CM | POA: Insufficient documentation

## 2023-12-15 DIAGNOSIS — E1140 Type 2 diabetes mellitus with diabetic neuropathy, unspecified: Secondary | ICD-10-CM | POA: Diagnosis not present

## 2023-12-15 DIAGNOSIS — T466X5A Adverse effect of antihyperlipidemic and antiarteriosclerotic drugs, initial encounter: Secondary | ICD-10-CM

## 2023-12-15 DIAGNOSIS — I10 Essential (primary) hypertension: Secondary | ICD-10-CM

## 2023-12-15 DIAGNOSIS — Z125 Encounter for screening for malignant neoplasm of prostate: Secondary | ICD-10-CM | POA: Diagnosis not present

## 2023-12-15 DIAGNOSIS — F17210 Nicotine dependence, cigarettes, uncomplicated: Secondary | ICD-10-CM

## 2023-12-15 DIAGNOSIS — Z6839 Body mass index (BMI) 39.0-39.9, adult: Secondary | ICD-10-CM

## 2023-12-15 DIAGNOSIS — G4733 Obstructive sleep apnea (adult) (pediatric): Secondary | ICD-10-CM

## 2023-12-15 DIAGNOSIS — E66812 Obesity, class 2: Secondary | ICD-10-CM | POA: Diagnosis not present

## 2023-12-15 LAB — POCT GLYCOSYLATED HEMOGLOBIN (HGB A1C)
HbA1c, POC (prediabetic range): 5.7 % (ref 5.7–6.4)
Hemoglobin A1C: 5.7 % — AB (ref 4.0–5.6)

## 2023-12-15 MED ORDER — CELECOXIB 200 MG PO CAPS: 200.0000 mg | ORAL_CAPSULE | Freq: Two times a day (BID) | ORAL | 1 refills | Status: DC

## 2023-12-15 MED ORDER — EZETIMIBE 10 MG PO TABS: 10.0000 mg | ORAL_TABLET | Freq: Every day | ORAL | 3 refills | Status: DC

## 2023-12-15 NOTE — Progress Notes (Signed)
        Established patient visit  History, exam, impression, and plan:  1. Essential hypertension (Primary) Pleasant 52 year old male presenting today for follow-up on hypertension.  He is currently on Toprol-XL 100 mg daily, tolerating well without side effects.  Monitoring blood pressures at home and notes his readings have been good.  Previously treated with amlodipine and valsartan however he has been able to stop these with his weight loss.  Denies any concerning symptoms today.  Has not seen Dr. Jens Som in a while but feels that he is doing well overall.  Blood pressure is good today at 132/74.  Checking labs as below.  Cardiopulmonary exam normal.  Continue Toprol-XL 100 mg daily as prescribed. - CBC - CMP14+EGFR - Lipid panel  2. Type 2 diabetes mellitus with diabetic neuropathy, without long-term current use of insulin (HCC) Recent diagnosis of type 2 diabetes which has been treated with Mounjaro 7.5 mg weekly.  Tolerating the medication well and has noted a little over 50 pounds lost since starting the medication.  Has been checking sugars intermittently and notes that fasting readings have been at goal.  Plan to continue Mounjaro 7.5 mg weekly although when his insurance deductible restarts, he will be unable to afford the medication.  Sending this over to clinical pharmacy to see if there is anything that we can do on our end to help with cutting costs.  Recheck of hemoglobin A1c today at 5.7%. - POCT HgB A1C  3. Class 2 severe obesity due to excess calories with serious comorbidity and body mass index (BMI) of 39.0 to 39.9 in adult [facility]) As noted above, he has lost a little over 50 pounds.  New BMI of 30.87.  4. Statin myopathy Has tried multiple statins in the past and was intolerant due to statin myopathy.  He is currently taking Zetia 10 mg daily which is well-tolerated.  Previously using Repatha however the company program ran out and was not renewed.  I have encouraged him to  reach out to the advanced lipid clinic to see if they can help get this restarted so that he can get back on Repatha.  Patient verbalized understanding and is agreeable to the plan.  5. OSA on CPAP Compliant with CPAP.  Reports his machine is up-to-date and he is good on supplies at this time.  6. Cigarette nicotine dependence without complication Started smoking at the age of 69 and continues to smoke.  On average, reports he smoked approximately 1 pack/day.  Referring for lung cancer screening evaluation. - Ambulatory Referral for Lung Cancer Scre  7. Coronary artery disease of native artery of native heart with stable angina pectoris [facility]) Previously managed by cardiology.  As noted, he is going to look into getting back on Repatha.  No recent chest pain, shortness of breath, palpitations, dizziness/lightheadedness, syncope, or lower extremity edema.  It has been a while since he saw cardiology so I have encouraged him to reach out to follow-up with them.  8. Prostate cancer screening Check PSA today. - PSA Total (Reflex To Free)  9. Colon cancer screening Referring for colonoscopy. - Ambulatory referral to Gastroenterology   Procedures performed this visit: None.  Return in about 6 months (around 06/14/2024) for DM/HTN/HLD follow up.  __________________________________ Thayer Ohm, DNP, APRN, FNP-BC Primary Care and Sports Medicine [facility]

## 2023-12-16 ENCOUNTER — Other Ambulatory Visit (HOSPITAL_BASED_OUTPATIENT_CLINIC_OR_DEPARTMENT_OTHER): Payer: Self-pay | Admitting: Internal Medicine

## 2023-12-16 ENCOUNTER — Other Ambulatory Visit: Payer: Self-pay | Admitting: Sports Medicine

## 2023-12-16 DIAGNOSIS — M5030 Other cervical disc degeneration, unspecified cervical region: Secondary | ICD-10-CM

## 2023-12-16 DIAGNOSIS — I10 Essential (primary) hypertension: Secondary | ICD-10-CM

## 2023-12-16 DIAGNOSIS — R600 Localized edema: Secondary | ICD-10-CM

## 2023-12-16 LAB — LIPID PANEL
Chol/HDL Ratio: 4.4 {ratio} (ref 0.0–5.0)
Cholesterol, Total: 188 mg/dL (ref 100–199)
HDL: 43 mg/dL (ref >39)
LDL Chol Calc (NIH): 126 mg/dL — ABNORMAL HIGH (ref 0–99)
Triglycerides: 106 mg/dL (ref 0–149)
VLDL Cholesterol Cal: 19 mg/dL (ref 5–40)

## 2023-12-16 LAB — CMP14+EGFR
ALT: 15 [IU]/L (ref 0–44)
AST: 10 [IU]/L (ref 0–40)
Albumin: 4.2 g/dL (ref 3.8–4.9)
Alkaline Phosphatase: 59 [IU]/L (ref 44–121)
BUN/Creatinine Ratio: 21 — ABNORMAL HIGH (ref 9–20)
BUN: 20 mg/dL (ref 6–24)
Bilirubin Total: 0.4 mg/dL (ref 0.0–1.2)
CO2: 23 mmol/L (ref 20–29)
Calcium: 9.5 mg/dL (ref 8.7–10.2)
Chloride: 104 mmol/L (ref 96–106)
Creatinine, Ser: 0.97 mg/dL (ref 0.76–1.27)
Globulin, Total: 2.7 g/dL (ref 1.5–4.5)
Glucose: 100 mg/dL — ABNORMAL HIGH (ref 70–99)
Potassium: 4.3 mmol/L (ref 3.5–5.2)
Sodium: 140 mmol/L (ref 134–144)
Total Protein: 6.9 g/dL (ref 6.0–8.5)
eGFR: 95 mL/min/{1.73_m2} (ref >59)

## 2023-12-16 LAB — CBC
Hematocrit: 46 % (ref 37.5–51.0)
Hemoglobin: 15.3 g/dL (ref 13.0–17.7)
MCH: 30 pg (ref 26.6–33.0)
MCHC: 33.3 g/dL (ref 31.5–35.7)
MCV: 90 fL (ref 79–97)
Platelets: 303 10*3/uL (ref 150–450)
RBC: 5.1 x10E6/uL (ref 4.14–5.80)
RDW: 12.4 % (ref 11.6–15.4)
WBC: 11.8 10*3/uL — ABNORMAL HIGH (ref 3.4–10.8)

## 2023-12-16 LAB — PSA TOTAL (REFLEX TO FREE): Prostate Specific Ag, Serum: 1.5 ng/mL (ref 0.0–4.0)

## 2023-12-16 MED ORDER — TRAMADOL HCL 50 MG PO TABS: 100.0000 mg | ORAL_TABLET | Freq: Three times a day (TID) | ORAL | 0 refills | Status: DC

## 2023-12-24 ENCOUNTER — Other Ambulatory Visit: Payer: Self-pay | Admitting: Sports Medicine

## 2023-12-24 DIAGNOSIS — M5412 Radiculopathy, cervical region: Secondary | ICD-10-CM

## 2024-01-11 DIAGNOSIS — G4733 Obstructive sleep apnea (adult) (pediatric): Secondary | ICD-10-CM | POA: Diagnosis not present

## 2024-01-12 DIAGNOSIS — M47812 Spondylosis without myelopathy or radiculopathy, cervical region: Secondary | ICD-10-CM | POA: Diagnosis not present

## 2024-01-12 DIAGNOSIS — M4722 Other spondylosis with radiculopathy, cervical region: Secondary | ICD-10-CM | POA: Diagnosis not present

## 2024-01-14 ENCOUNTER — Encounter: Payer: Self-pay | Admitting: Medical-Surgical

## 2024-01-21 ENCOUNTER — Other Ambulatory Visit: Payer: Self-pay | Admitting: Neurosurgery

## 2024-01-21 DIAGNOSIS — M4722 Other spondylosis with radiculopathy, cervical region: Secondary | ICD-10-CM

## 2024-01-22 ENCOUNTER — Encounter: Payer: Self-pay | Admitting: Medical-Surgical

## 2024-01-24 ENCOUNTER — Other Ambulatory Visit: Payer: Self-pay | Admitting: Physician Assistant

## 2024-01-25 ENCOUNTER — Telehealth: Payer: Self-pay | Admitting: Acute Care

## 2024-01-25 ENCOUNTER — Other Ambulatory Visit: Payer: Self-pay

## 2024-01-25 ENCOUNTER — Ambulatory Visit: Payer: BC Managed Care – PPO

## 2024-01-25 DIAGNOSIS — M50223 Other cervical disc displacement at C6-C7 level: Secondary | ICD-10-CM | POA: Diagnosis not present

## 2024-01-25 DIAGNOSIS — M2578 Osteophyte, vertebrae: Secondary | ICD-10-CM

## 2024-01-25 DIAGNOSIS — Z122 Encounter for screening for malignant neoplasm of respiratory organs: Secondary | ICD-10-CM

## 2024-01-25 DIAGNOSIS — M4802 Spinal stenosis, cervical region: Secondary | ICD-10-CM

## 2024-01-25 DIAGNOSIS — F17210 Nicotine dependence, cigarettes, uncomplicated: Secondary | ICD-10-CM

## 2024-01-25 DIAGNOSIS — M50922 Unspecified cervical disc disorder at C5-C6 level: Secondary | ICD-10-CM | POA: Diagnosis not present

## 2024-01-25 DIAGNOSIS — M4722 Other spondylosis with radiculopathy, cervical region: Secondary | ICD-10-CM

## 2024-01-25 DIAGNOSIS — Z87891 Personal history of nicotine dependence: Secondary | ICD-10-CM

## 2024-01-25 NOTE — Telephone Encounter (Signed)
Lung Cancer Screening Narrative/Criteria Questionnaire (Cigarette Smokers Only- No Cigars/Pipes/vapes)   Wesley Duran                SDMV:02/03/24 at 0930am with Orpha Bur                                           14-Nov-1971              LDCT: 02/04/24 at 0730a at [facility]    53 y.o.   Phone: [phone] /db  Lung Screening Narrative (confirm age 56-77 yrs Medicare / 50-80 yrs Private pay insurance)   Insurance information:BCBS   Referring Provider:[place]  This screening involves an initial phone call with a team member from our program. It is called a shared decision making visit. The initial meeting is required by insurance and Medicare to make sure you understand the program. This appointment takes about 15-20 minutes to complete. The CT scan will completed at a separate date/time. This scan takes about 5-10 minutes to complete and you may eat and drink before and after the scan.  Criteria questions for Lung Cancer Screening:   Are you a current or former smoker? Current Age began smoking: 53 yo   If you are a former smoker, what year did you quit smoking? {NA (within 15 yrs)   To calculate your smoking history, I need an accurate estimate of how many packs of cigarettes you smoked per day and for how many years. (Not just the number of PPD you are now smoking)   Years smoking 37 x Packs per day 1/2 to 1 = Pack years 28   (at least 20 pack yrs)   (Make sure they understand that we need to know how much they have smoked in the past, not just the number of PPD they are smoking now)  Do you have a personal history of cancer?  No    Do you have a family history of cancer? Yes  (cancer type and and relative) mat/grandfather - prostate  Are you coughing up blood?  No  Have you had unexplained weight loss of 15 lbs or more in the last 6 months? No  It looks like you meet all criteria.     Additional information: N/A

## 2024-02-03 ENCOUNTER — Encounter: Payer: BC Managed Care – PPO | Admitting: Adult Health

## 2024-02-04 ENCOUNTER — Ambulatory Visit: Payer: BC Managed Care – PPO

## 2024-02-08 DIAGNOSIS — G5622 Lesion of ulnar nerve, left upper limb: Secondary | ICD-10-CM | POA: Diagnosis not present

## 2024-02-08 DIAGNOSIS — G5603 Carpal tunnel syndrome, bilateral upper limbs: Secondary | ICD-10-CM | POA: Diagnosis not present

## 2024-02-11 DIAGNOSIS — G4733 Obstructive sleep apnea (adult) (pediatric): Secondary | ICD-10-CM | POA: Diagnosis not present

## 2024-02-17 ENCOUNTER — Encounter: Payer: BC Managed Care – PPO | Admitting: Adult Health

## 2024-02-18 ENCOUNTER — Telehealth: Payer: Self-pay | Admitting: *Deleted

## 2024-02-18 ENCOUNTER — Ambulatory Visit: Payer: BC Managed Care – PPO

## 2024-02-18 NOTE — Telephone Encounter (Signed)
 Called and left VM for pt to call to reschedule shared decision making visit. LDCT scheduled for 02/23/24 will need to be rescheduled for after the [facility].

## 2024-02-23 ENCOUNTER — Ambulatory Visit: Payer: BC Managed Care – PPO

## 2024-02-23 DIAGNOSIS — M4722 Other spondylosis with radiculopathy, cervical region: Secondary | ICD-10-CM | POA: Diagnosis not present

## 2024-02-23 DIAGNOSIS — Z6832 Body mass index (BMI) 32.0-32.9, adult: Secondary | ICD-10-CM | POA: Diagnosis not present

## 2024-02-23 DIAGNOSIS — G5622 Lesion of ulnar nerve, left upper limb: Secondary | ICD-10-CM | POA: Diagnosis not present

## 2024-03-09 ENCOUNTER — Other Ambulatory Visit (HOSPITAL_BASED_OUTPATIENT_CLINIC_OR_DEPARTMENT_OTHER): Payer: Self-pay | Admitting: Internal Medicine

## 2024-03-09 ENCOUNTER — Other Ambulatory Visit: Payer: Self-pay

## 2024-03-09 DIAGNOSIS — R600 Localized edema: Secondary | ICD-10-CM

## 2024-03-09 DIAGNOSIS — I10 Essential (primary) hypertension: Secondary | ICD-10-CM

## 2024-03-09 MED ORDER — FUROSEMIDE 20 MG PO TABS: 20.0000 mg | ORAL_TABLET | ORAL | 0 refills | Status: DC | PRN

## 2024-03-10 DIAGNOSIS — G4733 Obstructive sleep apnea (adult) (pediatric): Secondary | ICD-10-CM | POA: Diagnosis not present

## 2024-03-25 DIAGNOSIS — Z1211 Encounter for screening for malignant neoplasm of colon: Secondary | ICD-10-CM | POA: Diagnosis not present

## 2024-03-25 DIAGNOSIS — Z538 Procedure and treatment not carried out for other reasons: Secondary | ICD-10-CM | POA: Diagnosis not present

## 2024-03-25 DIAGNOSIS — I10 Essential (primary) hypertension: Secondary | ICD-10-CM | POA: Diagnosis not present

## 2024-03-25 DIAGNOSIS — K648 Other hemorrhoids: Secondary | ICD-10-CM | POA: Diagnosis not present

## 2024-03-25 DIAGNOSIS — I252 Old myocardial infarction: Secondary | ICD-10-CM | POA: Diagnosis not present

## 2024-03-25 LAB — HM COLONOSCOPY

## 2024-03-27 ENCOUNTER — Other Ambulatory Visit: Payer: Self-pay | Admitting: Sports Medicine

## 2024-03-27 DIAGNOSIS — M5412 Radiculopathy, cervical region: Secondary | ICD-10-CM

## 2024-04-07 ENCOUNTER — Other Ambulatory Visit: Payer: Self-pay | Admitting: Medical-Surgical

## 2024-04-07 DIAGNOSIS — I10 Essential (primary) hypertension: Secondary | ICD-10-CM

## 2024-04-12 ENCOUNTER — Other Ambulatory Visit: Payer: Self-pay | Admitting: Medical-Surgical

## 2024-04-12 DIAGNOSIS — I10 Essential (primary) hypertension: Secondary | ICD-10-CM

## 2024-04-12 NOTE — Telephone Encounter (Signed)
 Patient requesting rx rfo f amlodipine at 10mg   Was d/c at 10mg   but note in chart dated 01/14/2024 States to restart Amlodipine at 5mg  ( 2 week nurse visit recommended at that time does not show in chart ) Last OV 12/15/2023 Upcoming appt 04/29/2024

## 2024-04-13 NOTE — Telephone Encounter (Signed)
 Spoke with patient and schld nurse visit for tomorrow 04/14/24 for BP check

## 2024-04-14 ENCOUNTER — Ambulatory Visit (INDEPENDENT_AMBULATORY_CARE_PROVIDER_SITE_OTHER): Admitting: Medical-Surgical

## 2024-04-14 VITALS — BP 120/70 | HR 75 | Ht 68.0 in | Wt 237.0 lb

## 2024-04-14 DIAGNOSIS — E669 Obesity, unspecified: Secondary | ICD-10-CM

## 2024-04-14 DIAGNOSIS — I10 Essential (primary) hypertension: Secondary | ICD-10-CM

## 2024-04-14 DIAGNOSIS — G4733 Obstructive sleep apnea (adult) (pediatric): Secondary | ICD-10-CM

## 2024-04-14 NOTE — Progress Notes (Signed)
 Pt is here for blood pressure  check , pt denies trouble sleeping , palpitations or medication problems, Pt reports he has stopped taking monjaro 7.5 mg due to insurance, pt states he waiting for his deductible to go down before getting back on medication, pt next appt to fup with pcp is 04/29/24.

## 2024-04-15 ENCOUNTER — Encounter: Payer: Self-pay | Admitting: Medical-Surgical

## 2024-04-15 MED ORDER — AMLODIPINE BESYLATE 5 MG PO TABS: 2.5000 mg | ORAL_TABLET | Freq: Every day | ORAL | 1 refills | Status: DC

## 2024-04-15 MED ORDER — ZEPBOUND 2.5 MG/0.5ML ~~LOC~~ SOAJ: 2.5000 mg | SUBCUTANEOUS | 0 refills | Status: DC

## 2024-04-18 ENCOUNTER — Telehealth: Payer: Self-pay

## 2024-04-18 ENCOUNTER — Other Ambulatory Visit (HOSPITAL_COMMUNITY): Payer: Self-pay

## 2024-04-18 NOTE — Telephone Encounter (Signed)
 Pharmacy Patient Advocate Encounter   Received notification from CoverMyMeds that prior authorization for Zepbound  2.5 is required/requested.   Insurance verification completed.   The patient is insured through [facility] .   Per test claim: Patient has plan benefit exclusion for this medication. Cover my meds ZOX:[id number]

## 2024-04-21 ENCOUNTER — Other Ambulatory Visit (HOSPITAL_COMMUNITY): Payer: Self-pay

## 2024-04-25 ENCOUNTER — Other Ambulatory Visit (HOSPITAL_COMMUNITY): Payer: Self-pay

## 2024-04-26 ENCOUNTER — Other Ambulatory Visit (HOSPITAL_COMMUNITY): Payer: Self-pay

## 2024-04-26 NOTE — Telephone Encounter (Signed)
 Pharmacy Patient Advocate Encounter  Received notification from [facility] that Prior Authorization for Zepbound  2.5 has been DENIED.  Full denial letter will be uploaded to the media tab. See denial reason below.   PA #/Case ID/Reference #: [id number] Cheshire

## 2024-04-27 ENCOUNTER — Telehealth: Payer: Self-pay

## 2024-04-27 NOTE — Telephone Encounter (Signed)
 Patient informed of denial and will see provider at upcoming appt in June , 2025.

## 2024-04-27 NOTE — Telephone Encounter (Signed)
 error

## 2024-04-29 ENCOUNTER — Ambulatory Visit: Admitting: Medical-Surgical

## 2024-04-29 ENCOUNTER — Other Ambulatory Visit: Payer: Self-pay | Admitting: Physician Assistant

## 2024-04-29 ENCOUNTER — Other Ambulatory Visit: Payer: Self-pay | Admitting: Medical-Surgical

## 2024-04-29 DIAGNOSIS — G4733 Obstructive sleep apnea (adult) (pediatric): Secondary | ICD-10-CM

## 2024-04-29 DIAGNOSIS — E669 Obesity, unspecified: Secondary | ICD-10-CM

## 2024-04-29 NOTE — Telephone Encounter (Signed)
 Resubmitted PA [facility]

## 2024-05-10 ENCOUNTER — Telehealth: Payer: Self-pay | Admitting: Cardiology

## 2024-05-10 DIAGNOSIS — R600 Localized edema: Secondary | ICD-10-CM

## 2024-05-10 DIAGNOSIS — G4733 Obstructive sleep apnea (adult) (pediatric): Secondary | ICD-10-CM | POA: Diagnosis not present

## 2024-05-10 DIAGNOSIS — I10 Essential (primary) hypertension: Secondary | ICD-10-CM

## 2024-05-10 MED ORDER — FUROSEMIDE 20 MG PO TABS: 20.0000 mg | ORAL_TABLET | ORAL | 0 refills | Status: DC | PRN

## 2024-05-10 NOTE — Telephone Encounter (Signed)
*  STAT* If patient is at the pharmacy, call can be transferred to refill team.   1. Which medications need to be refilled? (please list name of each medication and dose if known) Furosemide    2. Would you like to learn more about the convenience, safety, & potential cost savings by using the [facility]?    3. Are you open to using the [facility] (Type [facility].    4. Which pharmacy/location (including street and city if local pharmacy) is medication to be sent to?[facility] [address], [place]   5. Do they need a 30 day or 90 day supply? Enough until his appointment on 07-25-24- please call today- out of Medicine

## 2024-05-10 NOTE — Telephone Encounter (Signed)
 RX sent to requested Pharmacy

## 2024-05-24 ENCOUNTER — Encounter: Payer: Self-pay | Admitting: Medical-Surgical

## 2024-05-25 MED ORDER — AMLODIPINE BESYLATE 5 MG PO TABS: 5.0000 mg | ORAL_TABLET | Freq: Every day | ORAL | 1 refills | Status: DC

## 2024-06-10 DIAGNOSIS — G4733 Obstructive sleep apnea (adult) (pediatric): Secondary | ICD-10-CM | POA: Diagnosis not present

## 2024-06-19 DIAGNOSIS — M5126 Other intervertebral disc displacement, lumbar region: Secondary | ICD-10-CM | POA: Insufficient documentation

## 2024-06-19 NOTE — Progress Notes (Unsigned)
   Established patient visit  History, exam, impression, and plan:  No problem-specific Assessment & Plan notes found for this encounter.   ROS  Physical Exam  Procedures performed this visit: None.  No follow-ups on file.  __________________________________ Thayer Ohm, DNP, APRN, FNP-BC Primary Care and Sports Medicine [facility]

## 2024-06-20 ENCOUNTER — Ambulatory Visit (INDEPENDENT_AMBULATORY_CARE_PROVIDER_SITE_OTHER): Payer: BC Managed Care – PPO | Admitting: Medical-Surgical

## 2024-06-20 VITALS — BP 138/78 | HR 62 | Resp 20 | Ht 68.0 in | Wt 238.0 lb

## 2024-06-20 DIAGNOSIS — E1140 Type 2 diabetes mellitus with diabetic neuropathy, unspecified: Secondary | ICD-10-CM

## 2024-06-20 DIAGNOSIS — I25118 Atherosclerotic heart disease of native coronary artery with other forms of angina pectoris: Secondary | ICD-10-CM | POA: Diagnosis not present

## 2024-06-20 DIAGNOSIS — E7849 Other hyperlipidemia: Secondary | ICD-10-CM

## 2024-06-20 DIAGNOSIS — H6503 Acute serous otitis media, bilateral: Secondary | ICD-10-CM

## 2024-06-20 DIAGNOSIS — I10 Essential (primary) hypertension: Secondary | ICD-10-CM | POA: Diagnosis not present

## 2024-06-20 DIAGNOSIS — Z23 Encounter for immunization: Secondary | ICD-10-CM | POA: Diagnosis not present

## 2024-06-20 DIAGNOSIS — G4733 Obstructive sleep apnea (adult) (pediatric): Secondary | ICD-10-CM

## 2024-06-20 DIAGNOSIS — F17210 Nicotine dependence, cigarettes, uncomplicated: Secondary | ICD-10-CM

## 2024-06-20 DIAGNOSIS — Z1331 Encounter for screening for depression: Secondary | ICD-10-CM

## 2024-06-20 DIAGNOSIS — G720 Drug-induced myopathy: Secondary | ICD-10-CM

## 2024-06-20 DIAGNOSIS — E669 Obesity, unspecified: Secondary | ICD-10-CM

## 2024-06-20 DIAGNOSIS — I214 Non-ST elevation (NSTEMI) myocardial infarction: Secondary | ICD-10-CM | POA: Diagnosis not present

## 2024-06-20 DIAGNOSIS — T466X5A Adverse effect of antihyperlipidemic and antiarteriosclerotic drugs, initial encounter: Secondary | ICD-10-CM

## 2024-06-20 LAB — POCT GLYCOSYLATED HEMOGLOBIN (HGB A1C)
HbA1c, POC (controlled diabetic range): 5.9 % (ref 0.0–7.0)
Hemoglobin A1C: 5.9 % — AB (ref 4.0–5.6)

## 2024-06-20 LAB — POCT UA - MICROALBUMIN
Albumin/Creatinine Ratio, Urine, POC: <30
Creatinine, POC: 100 mg/dL
Microalbumin Ur, POC: 10 mg/L

## 2024-06-20 MED ORDER — CEFDINIR 300 MG PO CAPS: 300.0000 mg | ORAL_CAPSULE | Freq: Two times a day (BID) | ORAL | 0 refills | Status: DC

## 2024-06-20 MED ORDER — QUAD CANE MISC: 0 refills | Status: DC

## 2024-06-20 MED ORDER — TIRZEPATIDE 2.5 MG/0.5ML ~~LOC~~ SOAJ: 2.5000 mg | SUBCUTANEOUS | 0 refills | Status: DC

## 2024-06-20 MED ORDER — QUAD CANE MISC: 0 refills | Status: AC

## 2024-06-21 ENCOUNTER — Other Ambulatory Visit (HOSPITAL_COMMUNITY): Payer: Self-pay

## 2024-06-21 ENCOUNTER — Ambulatory Visit: Payer: Self-pay | Admitting: Medical-Surgical

## 2024-06-21 ENCOUNTER — Encounter: Payer: Self-pay | Admitting: Medical-Surgical

## 2024-06-21 ENCOUNTER — Telehealth: Payer: Self-pay

## 2024-06-21 DIAGNOSIS — M5412 Radiculopathy, cervical region: Secondary | ICD-10-CM

## 2024-06-21 LAB — CBC
Hematocrit: 44.5 % (ref 37.5–51.0)
Hemoglobin: 15 g/dL (ref 13.0–17.7)
MCH: 30.1 pg (ref 26.6–33.0)
MCHC: 33.7 g/dL (ref 31.5–35.7)
MCV: 89 fL (ref 79–97)
Platelets: 235 10*3/uL (ref 150–450)
RBC: 4.98 x10E6/uL (ref 4.14–5.80)
RDW: 12.5 % (ref 11.6–15.4)
WBC: 8.4 10*3/uL (ref 3.4–10.8)

## 2024-06-21 LAB — CMP14+EGFR
ALT: 22 IU/L (ref 0–44)
AST: 17 IU/L (ref 0–40)
Albumin: 4.3 g/dL (ref 3.8–4.9)
Alkaline Phosphatase: 93 IU/L (ref 44–121)
BUN/Creatinine Ratio: 15 (ref 9–20)
BUN: 16 mg/dL (ref 6–24)
Bilirubin Total: 0.2 mg/dL (ref 0.0–1.2)
CO2: 20 mmol/L (ref 20–29)
Calcium: 9.6 mg/dL (ref 8.7–10.2)
Chloride: 101 mmol/L (ref 96–106)
Creatinine, Ser: 1.04 mg/dL (ref 0.76–1.27)
Globulin, Total: 2.5 g/dL (ref 1.5–4.5)
Glucose: 87 mg/dL (ref 70–99)
Potassium: 4.8 mmol/L (ref 3.5–5.2)
Sodium: 137 mmol/L (ref 134–144)
Total Protein: 6.8 g/dL (ref 6.0–8.5)
eGFR: 87 mL/min/{1.73_m2} (ref >59)

## 2024-06-21 LAB — LIPID PANEL
Chol/HDL Ratio: 6.4 ratio — ABNORMAL HIGH (ref 0.0–5.0)
Cholesterol, Total: 193 mg/dL (ref 100–199)
HDL: 30 mg/dL — ABNORMAL LOW (ref >39)
LDL Chol Calc (NIH): 102 mg/dL — ABNORMAL HIGH (ref 0–99)
Triglycerides: 356 mg/dL — ABNORMAL HIGH (ref 0–149)
VLDL Cholesterol Cal: 61 mg/dL — ABNORMAL HIGH (ref 5–40)

## 2024-06-21 NOTE — Telephone Encounter (Signed)
 Pharmacy Patient Advocate Encounter   Received notification from CoverMyMeds that prior authorization for Mounjaro  2.5MG /0.5ML auto-injectors is required/requested.   Insurance verification completed.   The patient is insured through [facility] .   Per test claim: PA required; PA submitted to above mentioned insurance via CoverMyMeds Key/confirmation #/EOC [id number] Status is pending

## 2024-06-21 NOTE — Telephone Encounter (Signed)
 Requesting rx rf of  Lyrica  300mg  Last written 03/28/2024 Last OV 06/20/2024 Upcoming appt 12/20/2024

## 2024-06-21 NOTE — Telephone Encounter (Signed)
 Pharmacy Patient Advocate Encounter  Received notification from [facility] that Prior Authorization for Mounjaro  2.5MG /0.5ML auto-injectors has been APPROVED from 06/21/24 to 06/21/25. Ran test claim, Copay is $165.58. This test claim was processed through [facility]- copay amounts may vary at other pharmacies due to pharmacy/plan contracts, or as the patient moves through the different stages of their insurance plan.   PA #/Case ID/Reference #: [id number]

## 2024-06-22 MED ORDER — PREGABALIN 300 MG PO CAPS: 300.0000 mg | ORAL_CAPSULE | Freq: Two times a day (BID) | ORAL | 1 refills | Status: DC

## 2024-06-22 NOTE — Telephone Encounter (Signed)
 Patient advised.

## 2024-07-04 ENCOUNTER — Encounter: Payer: Self-pay | Admitting: Sports Medicine

## 2024-07-04 ENCOUNTER — Ambulatory Visit

## 2024-07-04 ENCOUNTER — Ambulatory Visit (INDEPENDENT_AMBULATORY_CARE_PROVIDER_SITE_OTHER): Admitting: Sports Medicine

## 2024-07-04 DIAGNOSIS — R102 Pelvic and perineal pain: Secondary | ICD-10-CM

## 2024-07-04 DIAGNOSIS — M79662 Pain in left lower leg: Secondary | ICD-10-CM

## 2024-07-04 DIAGNOSIS — M5030 Other cervical disc degeneration, unspecified cervical region: Secondary | ICD-10-CM | POA: Diagnosis not present

## 2024-07-04 DIAGNOSIS — R1030 Lower abdominal pain, unspecified: Secondary | ICD-10-CM | POA: Diagnosis not present

## 2024-07-04 DIAGNOSIS — M1712 Unilateral primary osteoarthritis, left knee: Secondary | ICD-10-CM | POA: Diagnosis not present

## 2024-07-04 DIAGNOSIS — Z0189 Encounter for other specified special examinations: Secondary | ICD-10-CM

## 2024-07-04 DIAGNOSIS — M160 Bilateral primary osteoarthritis of hip: Secondary | ICD-10-CM

## 2024-07-04 DIAGNOSIS — M5136 Other intervertebral disc degeneration, lumbar region with discogenic back pain only: Secondary | ICD-10-CM

## 2024-07-04 DIAGNOSIS — M170 Bilateral primary osteoarthritis of knee: Secondary | ICD-10-CM

## 2024-07-04 DIAGNOSIS — M25562 Pain in left knee: Secondary | ICD-10-CM | POA: Diagnosis not present

## 2024-07-04 DIAGNOSIS — R1032 Left lower quadrant pain: Secondary | ICD-10-CM | POA: Diagnosis not present

## 2024-07-04 DIAGNOSIS — M7989 Other specified soft tissue disorders: Secondary | ICD-10-CM | POA: Diagnosis not present

## 2024-07-04 DIAGNOSIS — R1031 Right lower quadrant pain: Secondary | ICD-10-CM | POA: Diagnosis not present

## 2024-07-04 DIAGNOSIS — M25462 Effusion, left knee: Secondary | ICD-10-CM | POA: Diagnosis not present

## 2024-07-04 DIAGNOSIS — M25561 Pain in right knee: Secondary | ICD-10-CM | POA: Diagnosis not present

## 2024-07-04 NOTE — Assessment & Plan Note (Signed)
 Also known lumbar DDD, large protrusions L4-S1, dominant finding is an L5-S1 protrusion. He has had a few sets of epidurals the last of which was in December, we will restart with epidurals, L5-S1 interlaminar. He will also restart his neuropathic agents, Celebrex , tramadol , muscle relaxers. Return to see me 6 weeks after the epidural injections.

## 2024-07-04 NOTE — Assessment & Plan Note (Signed)
 Left worse than right knee pain. He is having mechanical symptoms including popping and catching. Mild effusion, tenderness medial joint line, positive McMurray's sign, pain with terminal flexion, suspect meniscal tearing. Adding some home physical therapy, x-rays, MRI left knee. If insufficient improvement at the 6-week follow-up we will consider injecting his knee.

## 2024-07-04 NOTE — Assessment & Plan Note (Signed)
 Pain both groins, moderate gelling, reproduction of pain with internal rotation with some loss of motion. Adding bilateral hip x-rays, home conditioning, return to see me in 6 weeks, injection if not better.

## 2024-07-04 NOTE — Progress Notes (Signed)
    Procedures performed today:    None.  Independent interpretation of notes and tests performed by another provider:   None.  Brief History, Exam, Impression, and Recommendations:    Degenerative disc disease, cervical Known cervical DDD, C5-C7 changes in the MRI with multiple areas of foraminal stenosis, currently on Lyrica , he is going to restart his muscle relaxer, tramadol , Celebrex . He did have some cervical epidurals about 6 months ago ordered by Dr. Gillie, Dr. Gillie had also suggested surgery. At this point he cannot afford surgery so we will proceed with another cervical epidural. Return to see me approximately 6 weeks.  Lumbar degenerative disc disease Also known lumbar DDD, large protrusions L4-S1, dominant finding is an L5-S1 protrusion. He has had a few sets of epidurals the last of which was in December, we will restart with epidurals, L5-S1 interlaminar. He will also restart his neuropathic agents, Celebrex , tramadol , muscle relaxers. Return to see me 6 weeks after the epidural injections.  Primary osteoarthritis of both knees Left worse than right knee pain. He is having mechanical symptoms including popping and catching. Mild effusion, tenderness medial joint line, positive McMurray's sign, pain with terminal flexion, suspect meniscal tearing. Adding some home physical therapy, x-rays, MRI left knee. If insufficient improvement at the 6-week follow-up we will consider injecting his knee.  Primary osteoarthritis of both hips Pain both groins, moderate gelling, reproduction of pain with internal rotation with some loss of motion. Adding bilateral hip x-rays, home conditioning, return to see me in 6 weeks, injection if not better.    ____________________________________________ Debby PARAS. Curtis, M.D., ABFM., CAQSM., AME. Primary Care and Sports Medicine [facility]  Adjunct Professor of Family Medicine  [employer]  Restaurant manager, fast food

## 2024-07-04 NOTE — Assessment & Plan Note (Signed)
 Known cervical DDD, C5-C7 changes in the MRI with multiple areas of foraminal stenosis, currently on Lyrica , he is going to restart his muscle relaxer, tramadol , Celebrex . He did have some cervical epidurals about 6 months ago ordered by Dr. Gillie, Dr. Gillie had also suggested surgery. At this point he cannot afford surgery so we will proceed with another cervical epidural. Return to see me approximately 6 weeks.

## 2024-07-05 ENCOUNTER — Ambulatory Visit: Payer: Self-pay | Admitting: Sports Medicine

## 2024-07-10 DIAGNOSIS — G4733 Obstructive sleep apnea (adult) (pediatric): Secondary | ICD-10-CM | POA: Diagnosis not present

## 2024-07-12 ENCOUNTER — Ambulatory Visit

## 2024-07-12 DIAGNOSIS — Z122 Encounter for screening for malignant neoplasm of respiratory organs: Secondary | ICD-10-CM | POA: Diagnosis not present

## 2024-07-12 DIAGNOSIS — M170 Bilateral primary osteoarthritis of knee: Secondary | ICD-10-CM | POA: Diagnosis not present

## 2024-07-12 DIAGNOSIS — F17210 Nicotine dependence, cigarettes, uncomplicated: Secondary | ICD-10-CM

## 2024-07-13 NOTE — Discharge Instructions (Signed)

## 2024-07-14 ENCOUNTER — Ambulatory Visit
Admission: RE | Admit: 2024-07-14 | Discharge: 2024-07-14 | Disposition: A | Discharge status: Home / Self Care | Source: Ambulatory Visit | Attending: Sports Medicine | Admitting: Sports Medicine

## 2024-07-14 DIAGNOSIS — M4722 Other spondylosis with radiculopathy, cervical region: Secondary | ICD-10-CM | POA: Diagnosis not present

## 2024-07-14 DIAGNOSIS — M5030 Other cervical disc degeneration, unspecified cervical region: Secondary | ICD-10-CM

## 2024-07-14 MED ORDER — TRIAMCINOLONE ACETONIDE 40 MG/ML IJ SUSP (RADIOLOGY)
60.0000 mg | Freq: Once | INTRAMUSCULAR | Status: AC
  Administered 2024-07-14: 60 mg via EPIDURAL

## 2024-07-14 MED ORDER — IOPAMIDOL (ISOVUE-M 300) INJECTION 61%
1.0000 mL | Freq: Once | INTRAMUSCULAR | Status: AC | PRN
  Administered 2024-07-14: 1 mL via EPIDURAL

## 2024-07-18 NOTE — Progress Notes (Signed)
 HPI: FU CAD.  Previously followed by Arley Pun, MD.  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  He had successful angioplasty at that time and no other obstructive coronary disease was noted; ejection fraction 70%.  Echocardiogram July 2023 showed normal LV function, mild left ventricular hypertrophy, grade 1 diastolic dysfunction, mild right ventricular enlargement.. Since last seen, patient denies dyspnea, chest pain, palpitations or syncope.  Current Outpatient Medications  Medication Sig Dispense Refill   albuterol  (VENTOLIN  HFA) 108 (90 Base) MCG/ACT inhaler Inhale 2 puffs into the lungs every 6 (six) hours as needed for wheezing. 1 each 11   AMBULATORY NON FORMULARY MEDICATION Continuous positive airway pressure (CPAP) machine set on autopap 5-15 cm of H2O pressure, with all supplemental supplies as needed. Patient to use Medium size Resmed Full Face Mirage Quattro mask and heated humidification. 1 each 0   AMBULATORY NON FORMULARY MEDICATION Medication Name: Oxygen  2L to use with CPAP for hypoxemic episodes. To be used nightly and if sleeping during the day. Please send to [facility]. 1 each 0   amLODipine  (NORVASC ) 5 MG tablet Take 1 tablet (5 mg total) by mouth daily. 90 tablet 1   aspirin  EC 81 MG tablet Take 1 tablet (81 mg total) by mouth daily. Swallow whole. 90 tablet 3   Blood Glucose Monitoring Suppl DEVI 1 each by Does not apply route 2 (two) times daily as needed. May substitute to any manufacturer covered by patient's insurance. 1 each 0   celecoxib  (CELEBREX ) 200 MG capsule Take 1 capsule (200 mg total) by mouth 2 (two) times daily. 180 capsule 1   cetirizine (ZYRTEC) 10 MG tablet Take 10 mg by mouth daily.     cyclobenzaprine  (FLEXERIL ) 10 MG tablet Take 1 tablet (10 mg total) by mouth in the morning and at bedtime. 180 tablet 3   ezetimibe  (ZETIA ) 10 MG tablet Take 1 tablet (10 mg total) by mouth  daily. 90 tablet 3   furosemide  (LASIX ) 20 MG tablet Take 1 tablet (20 mg total) by mouth as needed. 90 tablet 0   Glucose Blood (BLOOD GLUCOSE TEST STRIPS) STRP 1 each by In Vitro route 2 (two) times daily as needed. May substitute to any manufacturer covered by patient's insurance. 100 strip 11   Lancet Device MISC 1 each by Does not apply route 2 (two) times daily as needed. May substitute to any manufacturer covered by patient's insurance. 1 each 0   Lancets Misc. MISC 1 each by Does not apply route 2 (two) times daily as needed. May substitute to any manufacturer covered by patient's insurance. 100 each 11   metoprolol  succinate (TOPROL -XL) 100 MG 24 hr tablet Take 1 tablet (100 mg total) by mouth daily. 90 tablet 3   Misc. Devices (QUAD CANE) MISC Use with ambulation prn 1 each 0   pregabalin  (LYRICA ) 300 MG capsule Take 1 capsule (300 mg total) by mouth 2 (two) times daily. 180 capsule 1   sildenafil  (REVATIO ) 20 MG tablet Take 1-5 tablets (20-100 mg total) by mouth as needed. MDD: 100 mg 50 tablet 11   tirzepatide  (MOUNJARO ) 2.5 MG/0.5ML Pen Inject 2.5 mg into the skin once a week. 2 mL 0   traMADol  (ULTRAM ) 50 MG tablet Take 2 tablets (100 mg total) by mouth in the morning, at noon, and at bedtime. 360 tablet 0   traZODone  (DESYREL ) 100 MG tablet TAKE 1 TABLET BY MOUTH AT BEDTIME  90 tablet 0   triamcinolone  cream (KENALOG ) 0.1 % Apply 1 application. topically 2 (two) times daily. To affected areas on legs 45 g 0   cefdinir  (OMNICEF ) 300 MG capsule Take 1 capsule (300 mg total) by mouth 2 (two) times daily. 14 capsule 0   No current facility-administered medications for this visit.     Past Medical History:  Diagnosis Date   Arthritis    CAD (coronary artery disease)    Heart attack (HCC)    Hyperlipidemia    Hypertension    Sleep apnea     Past Surgical History:  Procedure Laterality Date   LEFT HEART CATH Left 2017    Social History   Socioeconomic History   Marital  status: Married    Spouse name: Not on file   Number of children: Not on file   Years of education: Not on file   Highest education level: Associate degree: occupational, Scientist, product/process development, or vocational program  Occupational History   Not on file  Tobacco Use   Smoking status: Every Day    Current packs/day: 1.00    Average packs/day: 1 pack/day for 30.0 years (30.0 ttl pk-yrs)    Types: Cigarettes   Smokeless tobacco: Never   Tobacco comments:    1 pack last 2-3 days   Vaping Use   Vaping status: Never Used  Substance and Sexual Activity   Alcohol use: Yes    Alcohol/week: 10.0 - 15.0 standard drinks of alcohol    Types: 10 - 15 Shots of liquor per week    Comment: 1-2 drinks per night   Drug use: Never   Sexual activity: Yes    Partners: Female    Birth control/protection: None  Other Topics Concern   Not on file  Social History Narrative   Not on file   Social Drivers of Health   Financial Resource Strain: High Risk (06/20/2024)   Overall Financial Resource Strain (CARDIA)    Difficulty of Paying Living Expenses: Very hard  Food Insecurity: Food Insecurity Present (06/20/2024)   Hunger Vital Sign    Worried About Running Out of Food in the Last Year: Often true    Ran Out of Food in the Last Year: Sometimes true  Transportation Needs: No Transportation Needs (06/20/2024)   PRAPARE - Administrator, Civil Service (Medical): No    Lack of Transportation (Non-Medical): No  Physical Activity: Inactive (06/20/2024)   Exercise Vital Sign    Days of Exercise per Week: 0 days    Minutes of Exercise per Session: Not on file  Stress: No Stress Concern Present (06/20/2024)   [employer] of Occupational Health - Occupational Stress Questionnaire    Feeling of Stress: Only a little  Social Connections: Moderately Integrated (06/20/2024)   Social Connection and Isolation Panel    Frequency of Communication with Friends and Family: Three times a week    Frequency of  Social Gatherings with Friends and Family: Once a week    Attends Religious Services: 1 to 4 times per year    Active Member of [employer] or Organizations: No    Attends Banker Meetings: Not on file    Marital Status: Married  Catering manager Violence: Not At Risk (02/19/2023)   Received from [facility]   HITS    Over the last 12 months how often did your partner physically hurt you?: Never    Over the last 12 months how often did your partner insult you  or talk down to you?: Never    Over the last 12 months how often did your partner threaten you with physical harm?: Never    Over the last 12 months how often did your partner scream or curse at you?: Never    Family History  Problem Relation Age of Onset   Hypertension Mother    Diabetes Father    Heart failure Father    Heart attack Father    Leukemia Maternal Grandmother    Prostate cancer Maternal Grandfather     ROS: no fevers or chills, productive cough, hemoptysis, dysphasia, odynophagia, melena, hematochezia, dysuria, hematuria, rash, seizure activity, orthopnea, PND, pedal edema, claudication. Remaining systems are negative.  Physical Exam: Well-developed well-nourished in no acute distress.  Skin is warm and dry.  HEENT is normal.  Neck is supple.  Chest is clear to auscultation with normal expansion.  Cardiovascular exam is regular rate and rhythm.  Abdominal exam nontender or distended. No masses palpated. Extremities show no edema. neuro grossly intact  EKG Interpretation Date/Time:  Monday July 25 2024 09:30:34 EDT Ventricular Rate:  54 PR Interval:  160 QRS Duration:  98 QT Interval:  432 QTC Calculation: 409 R Axis:   -23  Text Interpretation: Sinus bradycardia Minimal voltage criteria for LVH, may be normal variant ( R in aVL ) Confirmed by Pietro Rogue ([id number]) on 07/25/2024 9:31:14 AM    A/P  1 coronary artery disease-patient denies chest pain.  Continue aspirin .  Patient is  intolerant to statins.  2 hyperlipidemia-continue Zetia .  Recent LDL not at goal.  Resume Repatha .  Check lipids and liver in 8 weeks.  3 hypertension-patient's blood pressure is elevated; increase amlodipine  to 10 mg daily and follow.  4 tobacco abuse-patient again counseled on discontinuing.  We have given a prescription for nicotine  patches.  5 history of lower extremity edema-reasonably well-controlled.  Continue diuretic at present dose.  Rogue Pietro, MD

## 2024-07-25 ENCOUNTER — Ambulatory Visit: Attending: Cardiology | Admitting: Cardiology

## 2024-07-25 ENCOUNTER — Other Ambulatory Visit (HOSPITAL_COMMUNITY): Payer: Self-pay

## 2024-07-25 ENCOUNTER — Encounter: Payer: Self-pay | Admitting: Cardiology

## 2024-07-25 ENCOUNTER — Telehealth: Payer: Self-pay | Admitting: Pharmacy Technician

## 2024-07-25 VITALS — BP 146/90 | HR 54 | Ht 68.0 in | Wt 238.4 lb

## 2024-07-25 DIAGNOSIS — E785 Hyperlipidemia, unspecified: Secondary | ICD-10-CM | POA: Diagnosis not present

## 2024-07-25 DIAGNOSIS — R600 Localized edema: Secondary | ICD-10-CM

## 2024-07-25 DIAGNOSIS — I10 Essential (primary) hypertension: Secondary | ICD-10-CM

## 2024-07-25 DIAGNOSIS — I25118 Atherosclerotic heart disease of native coronary artery with other forms of angina pectoris: Secondary | ICD-10-CM | POA: Diagnosis not present

## 2024-07-25 DIAGNOSIS — Z720 Tobacco use: Secondary | ICD-10-CM

## 2024-07-25 MED ORDER — NICOTINE 21 MG/24HR TD PT24: 21.0000 mg | MEDICATED_PATCH | Freq: Every day | TRANSDERMAL | 0 refills | Status: DC

## 2024-07-25 MED ORDER — AMLODIPINE BESYLATE 10 MG PO TABS: 10.0000 mg | ORAL_TABLET | Freq: Every day | ORAL | 3 refills | Status: AC

## 2024-07-25 MED ORDER — NICOTINE 7 MG/24HR TD PT24: 7.0000 mg | MEDICATED_PATCH | Freq: Every day | TRANSDERMAL | 0 refills | Status: DC

## 2024-07-25 MED ORDER — NICOTINE 14 MG/24HR TD PT24: 14.0000 mg | MEDICATED_PATCH | Freq: Every day | TRANSDERMAL | 0 refills | Status: DC

## 2024-07-25 NOTE — Telephone Encounter (Signed)
 Per staff message  Needs pa per test claim  Pharmacy Patient Advocate Encounter   Received notification from Pt Calls Messages that prior authorization for repatha  is required/requested.   Insurance verification completed.   The patient is insured through [facility] .   Per test claim: PA required; PA submitted to above mentioned insurance via latent Key/confirmation #/EOC [id number] Status is pending

## 2024-07-25 NOTE — Patient Instructions (Signed)
 Medication Instructions:   INCREASE AMLODIPINE  TO 10 MG ONCE DAILY= 2 OF THE 3 MG TABLETS ONCE DAILY  NICOTINE  PATCHES 21 MG, 14 MG AND 7 MG  *If you need a refill on your cardiac medications before your next appointment, please call your pharmacy*   Follow-Up: At [facility], you and your health needs are our priority.  As part of our continuing mission to provide you with exceptional heart care, our providers are all part of one team.  This team includes your primary Cardiologist (physician) and Advanced Practice Providers or APPs (Physician Assistants and Nurse Practitioners) who all work together to provide you with the care you need, when you need it.  Your next appointment:   12 month(s)  Provider:   Redell Shallow, MD

## 2024-07-26 ENCOUNTER — Telehealth: Payer: Self-pay

## 2024-07-26 ENCOUNTER — Other Ambulatory Visit (HOSPITAL_COMMUNITY): Payer: Self-pay

## 2024-07-26 NOTE — Telephone Encounter (Signed)
 Pharmacy Patient Advocate Encounter  Insurance verification completed.   The patient is insured through [facility]   Ran test claim for REPATHA . Currently a quantity of 2 ML is a 28 day supply and the co-pay is $24.99 WITH [employer] .  This test claim was processed through [facility]- copay amounts may vary at other pharmacies due to pharmacy/plan contracts, or as the patient moves through the different stages of their insurance plan.   [employer] paid $142.46 towards copay

## 2024-07-26 NOTE — Telephone Encounter (Signed)
 Pharmacy Patient Advocate Encounter  Received notification from [facility] that Prior Authorization for repatha  has been APPROVED from 07/25/24 to 07/25/25   Can someone send in a prescription for repatha ? I do not see a active prescription for repatha  on file.

## 2024-07-27 ENCOUNTER — Other Ambulatory Visit (HOSPITAL_COMMUNITY): Payer: Self-pay

## 2024-07-27 ENCOUNTER — Other Ambulatory Visit: Payer: Self-pay

## 2024-07-27 ENCOUNTER — Other Ambulatory Visit (HOSPITAL_BASED_OUTPATIENT_CLINIC_OR_DEPARTMENT_OTHER): Payer: Self-pay

## 2024-07-27 MED ORDER — REPATHA SURECLICK 140 MG/ML ~~LOC~~ SOAJ
1.0000 mL | SUBCUTANEOUS | 11 refills | Status: AC
  Filled 2024-07-27 – 2024-08-22 (×2): qty 2, 28d supply, fill #0

## 2024-07-27 NOTE — Telephone Encounter (Signed)
 Pt ok with cost of $24.99. Will send rx to [facility] for delivery. Advised that [facility] will reach out to him for payment. If issues with cost arise in future to call us .

## 2024-07-27 NOTE — Addendum Note (Signed)
 Addended by: Kalem Rockwell D on: 07/27/2024 10:25 AM   Modules accepted: Orders

## 2024-07-28 NOTE — Discharge Instructions (Signed)

## 2024-07-29 ENCOUNTER — Ambulatory Visit
Admission: RE | Admit: 2024-07-29 | Discharge: 2024-07-29 | Disposition: A | Discharge status: Home / Self Care | Source: Ambulatory Visit | Attending: Sports Medicine | Admitting: Sports Medicine

## 2024-07-29 DIAGNOSIS — M5136 Other intervertebral disc degeneration, lumbar region with discogenic back pain only: Secondary | ICD-10-CM

## 2024-07-29 DIAGNOSIS — M47817 Spondylosis without myelopathy or radiculopathy, lumbosacral region: Secondary | ICD-10-CM | POA: Diagnosis not present

## 2024-07-29 MED ORDER — METHYLPREDNISOLONE ACETATE 40 MG/ML INJ SUSP (RADIOLOG
80.0000 mg | Freq: Once | INTRAMUSCULAR | Status: AC
  Administered 2024-07-29: 80 mg via EPIDURAL

## 2024-07-29 MED ORDER — IOPAMIDOL (ISOVUE-M 200) INJECTION 41%
1.0000 mL | Freq: Once | INTRAMUSCULAR | Status: AC
  Administered 2024-07-29: 1 mL via EPIDURAL

## 2024-08-01 ENCOUNTER — Other Ambulatory Visit: Payer: Self-pay

## 2024-08-08 ENCOUNTER — Other Ambulatory Visit: Payer: Self-pay | Admitting: Physician Assistant

## 2024-08-10 DIAGNOSIS — G4733 Obstructive sleep apnea (adult) (pediatric): Secondary | ICD-10-CM | POA: Diagnosis not present

## 2024-08-11 ENCOUNTER — Other Ambulatory Visit: Payer: Self-pay | Admitting: *Deleted

## 2024-08-11 DIAGNOSIS — E785 Hyperlipidemia, unspecified: Secondary | ICD-10-CM

## 2024-08-18 NOTE — Telephone Encounter (Signed)
 Pt requesting c/b from pharmacist regarding cost of Repatha . Pt states that $ 24.99 is still a little costly for him especially being that he is not working. Pt would like to know if there is a way that he's able to get medication at a lower cost if not for free. Please advise

## 2024-08-22 ENCOUNTER — Other Ambulatory Visit (HOSPITAL_COMMUNITY): Payer: Self-pay

## 2024-08-22 ENCOUNTER — Telehealth: Payer: Self-pay | Admitting: Pharmacy Technician

## 2024-08-22 ENCOUNTER — Other Ambulatory Visit: Payer: Self-pay

## 2024-08-22 NOTE — Telephone Encounter (Signed)
 Patient made aware that coupon was given to [facility]. He still needs to call to set up payment. # given

## 2024-08-22 NOTE — Telephone Encounter (Signed)
 Can we get and apply a copay card instead and see if we can get to $15?

## 2024-08-22 NOTE — Telephone Encounter (Signed)
 Sent pharm staff message to run on ins and coupon

## 2024-08-22 NOTE — Telephone Encounter (Signed)
     Sent pharm staff message to run on ins and coupon

## 2024-08-23 ENCOUNTER — Other Ambulatory Visit: Payer: Self-pay

## 2024-08-24 ENCOUNTER — Other Ambulatory Visit (HOSPITAL_COMMUNITY): Payer: Self-pay

## 2024-08-30 ENCOUNTER — Encounter: Payer: Self-pay | Admitting: Sports Medicine

## 2024-09-06 ENCOUNTER — Other Ambulatory Visit: Payer: Self-pay | Admitting: Medical-Surgical

## 2024-09-07 ENCOUNTER — Other Ambulatory Visit: Payer: Self-pay | Admitting: Cardiology

## 2024-09-10 DIAGNOSIS — G4733 Obstructive sleep apnea (adult) (pediatric): Secondary | ICD-10-CM | POA: Diagnosis not present

## 2024-09-12 ENCOUNTER — Ambulatory Visit: Admitting: Sports Medicine

## 2024-09-15 ENCOUNTER — Ambulatory Visit: Admitting: Physical Medicine and Rehabilitation

## 2024-09-15 ENCOUNTER — Telehealth: Payer: Self-pay

## 2024-09-15 ENCOUNTER — Encounter: Payer: Self-pay | Admitting: Medical-Surgical

## 2024-09-15 ENCOUNTER — Ambulatory Visit

## 2024-09-15 DIAGNOSIS — M51369 Other intervertebral disc degeneration, lumbar region without mention of lumbar back pain or lower extremity pain: Secondary | ICD-10-CM

## 2024-09-15 DIAGNOSIS — M5030 Other cervical disc degeneration, unspecified cervical region: Secondary | ICD-10-CM

## 2024-09-15 MED ORDER — TRAMADOL HCL 50 MG PO TABS: 100.0000 mg | ORAL_TABLET | Freq: Three times a day (TID) | ORAL | 0 refills | Status: DC

## 2024-09-15 NOTE — Telephone Encounter (Signed)
 Forwarding to Dr. Alvia covering Dr. Curtis  Requesting rx rf of Tramadol  560mg  Last written 12/16/2023 Last OV 07/04/2024 Upcoming appt 12/20/2024

## 2024-09-18 MED ORDER — TRAMADOL HCL 50 MG PO TABS: 100.0000 mg | ORAL_TABLET | Freq: Three times a day (TID) | ORAL | 0 refills | Status: DC

## 2024-09-23 DIAGNOSIS — M542 Cervicalgia: Secondary | ICD-10-CM | POA: Diagnosis not present

## 2024-09-23 DIAGNOSIS — M545 Low back pain, unspecified: Secondary | ICD-10-CM | POA: Diagnosis not present

## 2024-09-28 ENCOUNTER — Other Ambulatory Visit: Payer: Self-pay | Admitting: Cardiology

## 2024-09-28 ENCOUNTER — Other Ambulatory Visit: Payer: Self-pay | Admitting: Medical-Surgical

## 2024-09-28 DIAGNOSIS — M5030 Other cervical disc degeneration, unspecified cervical region: Secondary | ICD-10-CM

## 2024-09-28 DIAGNOSIS — R600 Localized edema: Secondary | ICD-10-CM

## 2024-09-28 DIAGNOSIS — I10 Essential (primary) hypertension: Secondary | ICD-10-CM

## 2024-09-28 MED ORDER — CYCLOBENZAPRINE HCL 10 MG PO TABS
10.0000 mg | ORAL_TABLET | Freq: Two times a day (BID) | ORAL | 3 refills | Status: AC
Start: 2024-09-28 — End: ?

## 2024-09-28 NOTE — Addendum Note (Signed)
 Addended by: Kilee Hedding P on: 09/28/2024 11:04 AM   Modules accepted: Orders

## 2024-09-28 NOTE — Telephone Encounter (Signed)
 Requesting rx rf of cyclobenzaprine  10mg   Last written 05/12/2023 Last OV 07/04/2024 with Dr. Curtis Upcoming appt 12/20/2024 with Zada Palin, NP  Zada Palin, NP showing as PCP but do not see a TOC visit scheduled.

## 2024-10-12 NOTE — Telephone Encounter (Signed)
 Already addressed

## 2024-10-31 ENCOUNTER — Encounter: Payer: Self-pay | Admitting: Radiology

## 2024-11-02 ENCOUNTER — Encounter: Payer: Self-pay | Admitting: Medical-Surgical

## 2024-11-02 ENCOUNTER — Other Ambulatory Visit: Payer: Self-pay | Admitting: Physician Assistant

## 2024-11-02 DIAGNOSIS — E1140 Type 2 diabetes mellitus with diabetic neuropathy, unspecified: Secondary | ICD-10-CM

## 2024-11-02 MED ORDER — TIRZEPATIDE 2.5 MG/0.5ML ~~LOC~~ SOAJ: 2.5000 mg | SUBCUTANEOUS | 0 refills | Status: DC

## 2024-11-10 DIAGNOSIS — G4733 Obstructive sleep apnea (adult) (pediatric): Secondary | ICD-10-CM | POA: Diagnosis not present

## 2024-11-28 ENCOUNTER — Other Ambulatory Visit: Payer: Self-pay | Admitting: Medical-Surgical

## 2024-11-28 MED ORDER — TIRZEPATIDE 5 MG/0.5ML ~~LOC~~ SOAJ: 5.0000 mg | SUBCUTANEOUS | 0 refills | Status: DC

## 2024-12-07 ENCOUNTER — Other Ambulatory Visit: Payer: Self-pay | Admitting: Medical-Surgical

## 2024-12-07 DIAGNOSIS — M5030 Other cervical disc degeneration, unspecified cervical region: Secondary | ICD-10-CM

## 2024-12-08 NOTE — Telephone Encounter (Signed)
 TRAMADOL  Last OV: 06/20/24 Next OV: 12/20/24 Last RF: 09/18/24

## 2024-12-16 ENCOUNTER — Other Ambulatory Visit: Payer: Self-pay | Admitting: Medical-Surgical

## 2024-12-16 DIAGNOSIS — I10 Essential (primary) hypertension: Secondary | ICD-10-CM

## 2024-12-20 ENCOUNTER — Ambulatory Visit: Admitting: Medical-Surgical

## 2024-12-20 DIAGNOSIS — I10 Essential (primary) hypertension: Secondary | ICD-10-CM

## 2024-12-20 DIAGNOSIS — E7849 Other hyperlipidemia: Secondary | ICD-10-CM

## 2024-12-20 DIAGNOSIS — E1140 Type 2 diabetes mellitus with diabetic neuropathy, unspecified: Secondary | ICD-10-CM

## 2024-12-20 DIAGNOSIS — M5412 Radiculopathy, cervical region: Secondary | ICD-10-CM

## 2025-01-02 ENCOUNTER — Ambulatory Visit: Admitting: Medical-Surgical

## 2025-01-02 ENCOUNTER — Other Ambulatory Visit: Payer: Self-pay | Admitting: Medical-Surgical

## 2025-01-02 VITALS — BP 115/70 | HR 68 | Temp 98.0°F | Resp 20 | Ht 68.0 in | Wt 241.0 lb

## 2025-01-02 DIAGNOSIS — I10 Essential (primary) hypertension: Secondary | ICD-10-CM

## 2025-01-02 DIAGNOSIS — M5030 Other cervical disc degeneration, unspecified cervical region: Secondary | ICD-10-CM

## 2025-01-02 DIAGNOSIS — M5412 Radiculopathy, cervical region: Secondary | ICD-10-CM | POA: Diagnosis not present

## 2025-01-02 DIAGNOSIS — J069 Acute upper respiratory infection, unspecified: Secondary | ICD-10-CM

## 2025-01-02 DIAGNOSIS — E1140 Type 2 diabetes mellitus with diabetic neuropathy, unspecified: Secondary | ICD-10-CM

## 2025-01-02 LAB — POCT GLYCOSYLATED HEMOGLOBIN (HGB A1C)
HbA1c, POC (controlled diabetic range): 5.6 % (ref 0.0–7.0)
Hemoglobin A1C: 5.6 % (ref 4.0–5.6)

## 2025-01-02 MED ORDER — TRAMADOL HCL 50 MG PO TABS: 100.0000 mg | ORAL_TABLET | Freq: Three times a day (TID) | ORAL | 1 refills | Status: AC

## 2025-01-02 MED ORDER — PREGABALIN 300 MG PO CAPS: 300.0000 mg | ORAL_CAPSULE | Freq: Two times a day (BID) | ORAL | 1 refills | Status: AC
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
## 2025-01-05 ENCOUNTER — Other Ambulatory Visit: Payer: Self-pay | Admitting: Medical-Surgical

## 2025-01-10 ENCOUNTER — Encounter: Payer: Self-pay | Admitting: Medical-Surgical

## 2025-01-14 ENCOUNTER — Other Ambulatory Visit: Payer: Self-pay | Admitting: Medical-Surgical

## 2025-01-14 DIAGNOSIS — I10 Essential (primary) hypertension: Secondary | ICD-10-CM

## 2025-01-31 ENCOUNTER — Other Ambulatory Visit: Payer: Self-pay | Admitting: Medical-Surgical

## 2025-02-01 ENCOUNTER — Telehealth: Admitting: Nurse Practitioner

## 2025-02-01 DIAGNOSIS — B9689 Other specified bacterial agents as the cause of diseases classified elsewhere: Secondary | ICD-10-CM

## 2025-02-01 DIAGNOSIS — R059 Cough, unspecified: Secondary | ICD-10-CM

## 2025-02-01 MED ORDER — PROMETHAZINE-DM 6.25-15 MG/5ML PO SYRP: 5.0000 mL | ORAL_SOLUTION | Freq: Four times a day (QID) | ORAL | 0 refills | Status: AC | PRN

## 2025-02-01 MED ORDER — DOXYCYCLINE HYCLATE 100 MG PO TABS: 100.0000 mg | ORAL_TABLET | Freq: Two times a day (BID) | ORAL | 0 refills | Status: AC

## 2025-02-01 MED ORDER — BENZONATATE 100 MG PO CAPS: 100.0000 mg | ORAL_CAPSULE | Freq: Three times a day (TID) | ORAL | 0 refills | Status: DC | PRN

## 2025-02-01 NOTE — Addendum Note (Signed)
 Addended by: MELVENIA IP on: 02/01/2025 09:26 AM   Modules accepted: Orders

## 2025-02-01 NOTE — Progress Notes (Signed)
 We are sorry that you are not feeling well.  Here is how we plan to help!  Based on your presentation I believe you most likely have A cough due to bacteria.  When patients have a fever and a productive cough with a change in color or increased sputum production, we are concerned about bacterial bronchitis.  If left untreated it can progress to pneumonia.  If your symptoms do not improve with your treatment plan it is important that you contact your provider.   I have prescribed Doxycycline  100 mg twice a day for 7 days     In addition you may use A prescription cough medication called Tessalon  Perles 100mg . You may take 1-2 capsules every 8 hours as needed for your cough.   From your responses in the eVisit questionnaire you describe inflammation in the upper respiratory tract which is causing a significant cough.  This is commonly called Bronchitis and has four common causes:   Allergies Viral Infections Acid Reflux Bacterial Infection Allergies, viruses and acid reflux are treated by controlling symptoms or eliminating the cause. An example might be a cough caused by taking certain blood pressure medications. You stop the cough by changing the medication. Another example might be a cough caused by acid reflux. Controlling the reflux helps control the cough.  USE OF BRONCHODILATOR (RESCUE) INHALERS: There is a risk from using your bronchodilator too frequently.  The risk is that over-reliance on a medication which only relaxes the muscles surrounding the breathing tubes can reduce the effectiveness of medications prescribed to reduce swelling and congestion of the tubes themselves.  Although you feel brief relief from the bronchodilator inhaler, your asthma may actually be worsening with the tubes becoming more swollen and filled with mucus.  This can delay other crucial treatments, such as oral steroid medications. If you need to use a bronchodilator inhaler daily, several times per day, you  should discuss this with your provider.  There are probably better treatments that could be used to keep your asthma under control.     HOME CARE Only take medications as instructed by your medical team. Complete the entire course of an antibiotic. Drink plenty of fluids and get plenty of rest. Avoid close contacts especially the very young and the elderly Cover your mouth if you cough or cough into your sleeve. Always remember to wash your hands A steam or ultrasonic humidifier can help congestion.   GET HELP RIGHT AWAY IF: You develop worsening fever. You become short of breath You cough up blood. Your symptoms persist after you have completed your treatment plan MAKE SURE YOU  Understand these instructions. Will watch your condition. Will get help right away if you are not doing well or get worse.  Your e-visit answers were reviewed by a board certified advanced clinical practitioner to complete your personal care plan.  Depending on the condition, your plan could have included both over the counter or prescription medications. If there is a problem please reply  once you have received a response from your provider. Your safety is important to us .  If you have drug allergies check your prescription carefully.    You can use MyChart to ask questions about todays visit, request a non-urgent call back, or ask for a work or school excuse for 24 hours related to this e-Visit. If it has been greater than 24 hours you will need to follow up with your provider, or enter a new e-Visit to address those concerns. You  will get an e-mail in the next two days asking about your experience.  I hope that your e-visit has been valuable and will speed your recovery. Thank you for using e-visits.   I have spent 5 minutes in review of e-visit questionnaire, review and updating patient chart, medical decision making and response to patient.   Wesley JINNY Sierras, NP

## 2025-07-03 ENCOUNTER — Ambulatory Visit: Admitting: Medical-Surgical
